# Patient Record
Sex: Male | Born: 1950 | Race: White | Hispanic: No | Marital: Married | Smoking: Former smoker
Health system: Southern US, Community
[De-identification: ages and names within clinical notes are randomized; demographics above are authoritative.]

## PROBLEM LIST (undated history)

## (undated) DIAGNOSIS — K219 Gastro-esophageal reflux disease without esophagitis: Secondary | ICD-10-CM

## (undated) DIAGNOSIS — J45909 Unspecified asthma, uncomplicated: Secondary | ICD-10-CM

## (undated) DIAGNOSIS — C801 Malignant (primary) neoplasm, unspecified: Secondary | ICD-10-CM

## (undated) HISTORY — DX: Gastro-esophageal reflux disease without esophagitis: K21.9

## (undated) HISTORY — DX: Malignant (primary) neoplasm, unspecified: C80.1

---

## 1999-12-18 ENCOUNTER — Encounter: Payer: Self-pay | Admitting: Emergency Medicine

## 1999-12-18 ENCOUNTER — Emergency Department (HOSPITAL_COMMUNITY): Admission: EM | Admit: 1999-12-18 | Discharge: 1999-12-18 | Payer: Self-pay | Admitting: Emergency Medicine

## 2002-01-30 ENCOUNTER — Emergency Department (HOSPITAL_COMMUNITY): Admission: EM | Admit: 2002-01-30 | Discharge: 2002-01-30 | Payer: Self-pay | Admitting: Emergency Medicine

## 2006-10-30 HISTORY — PX: COLONOSCOPY: SHX174

## 2006-11-23 ENCOUNTER — Ambulatory Visit: Payer: Self-pay | Admitting: Gastroenterology

## 2012-10-30 DIAGNOSIS — C801 Malignant (primary) neoplasm, unspecified: Secondary | ICD-10-CM

## 2012-10-30 HISTORY — DX: Malignant (primary) neoplasm, unspecified: C80.1

## 2012-11-12 ENCOUNTER — Ambulatory Visit: Payer: Self-pay | Admitting: Urology

## 2013-07-08 ENCOUNTER — Ambulatory Visit: Payer: Self-pay | Admitting: Urology

## 2013-07-09 ENCOUNTER — Ambulatory Visit: Payer: Self-pay | Admitting: Urology

## 2013-09-05 HISTORY — PX: PROSTATE SURGERY: SHX751

## 2015-02-09 IMAGING — CT CT ABDOMEN AND PELVIS WITHOUT AND WITH CONTRAST
2 of 4 series · 13 of 32 positions shown, 18 images · non-contrast
Comparison: none

REASON FOR EXAM: Prostate CA
COMMENTS:

[Series 2: wo 3.0 i40f 3 · axial · 0.83mm/px · z∈[-1054,-817]mm · 5 of 159 slices shown]
[im 20/159  soft-tissue]
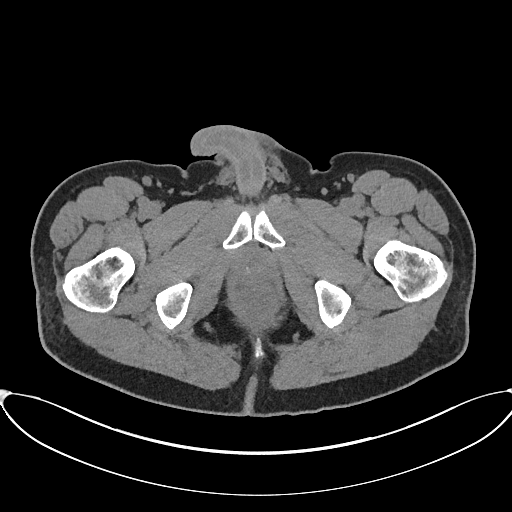
[im 40/159  soft-tissue]
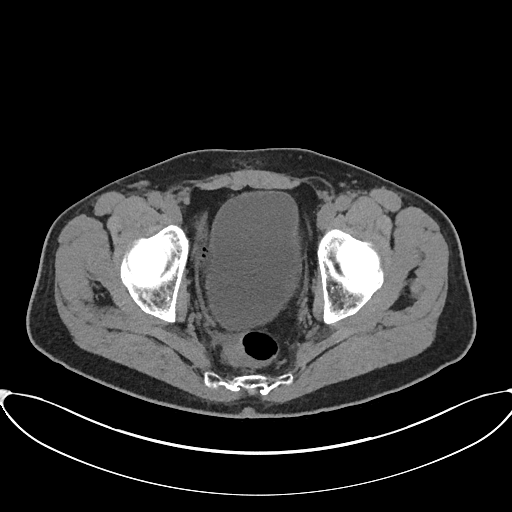
[im 60/159  soft-tissue]
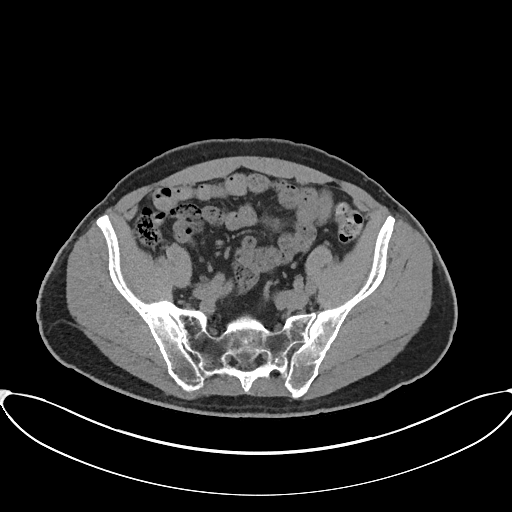
[im 80/159  soft-tissue]
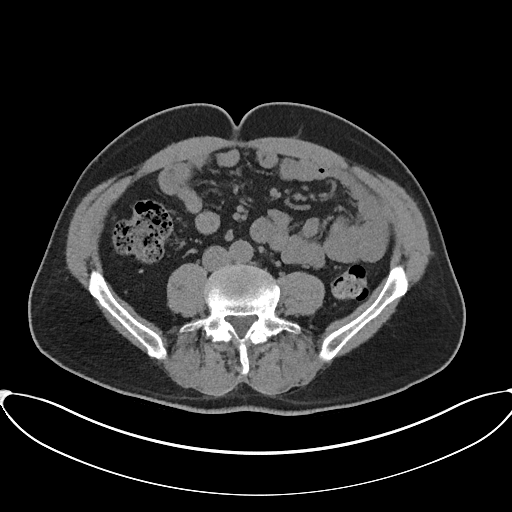
[im 99/159  soft-tissue]
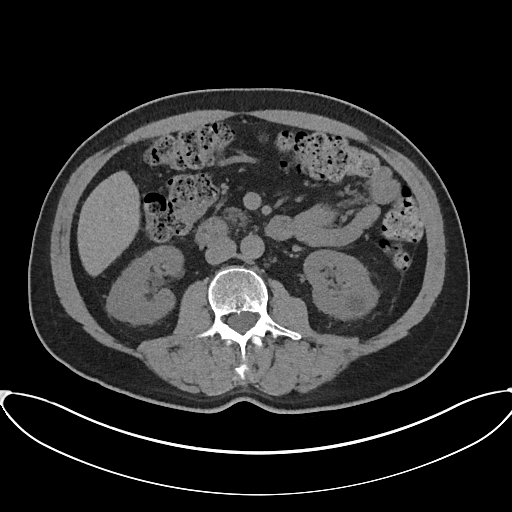

[Series 4: with 3.0 i40f 3 · axial · 0.83mm/px · z∈[-1054,-691]mm · 8 of 157 slices shown, 13 images]
[im 18/157  soft-tissue]
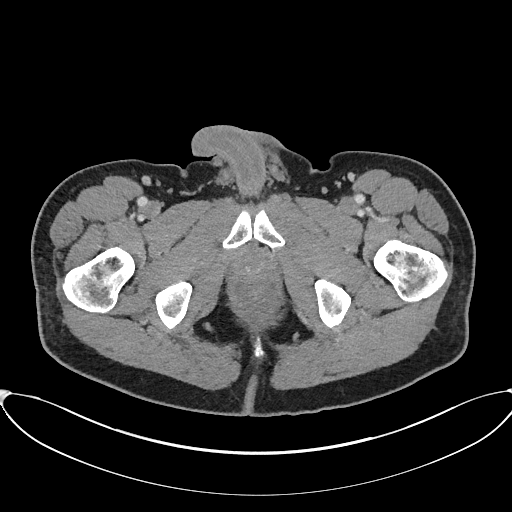
[im 18/157  bone]
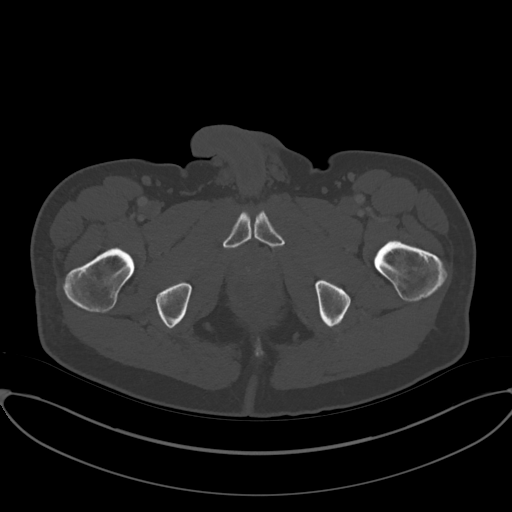
[im 35/157  soft-tissue]
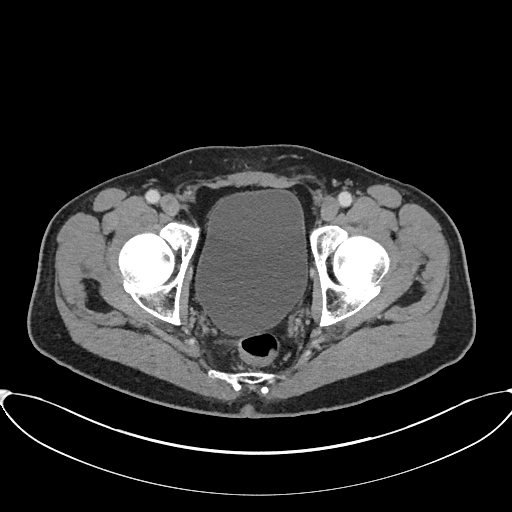
[im 53/157  soft-tissue]
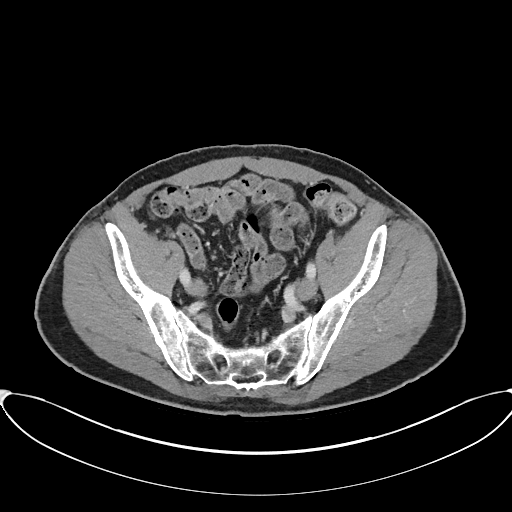
[im 70/157  soft-tissue]
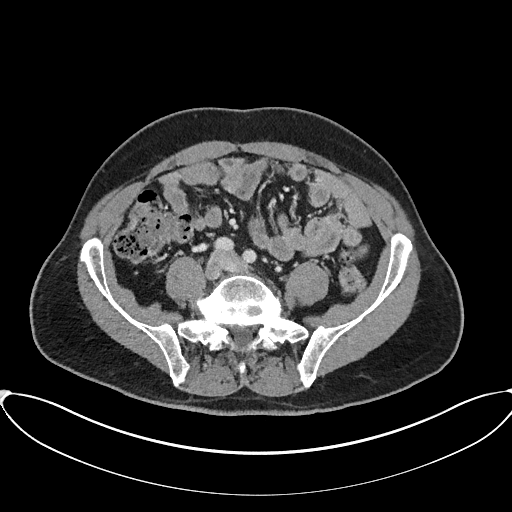
[im 87/157  soft-tissue]
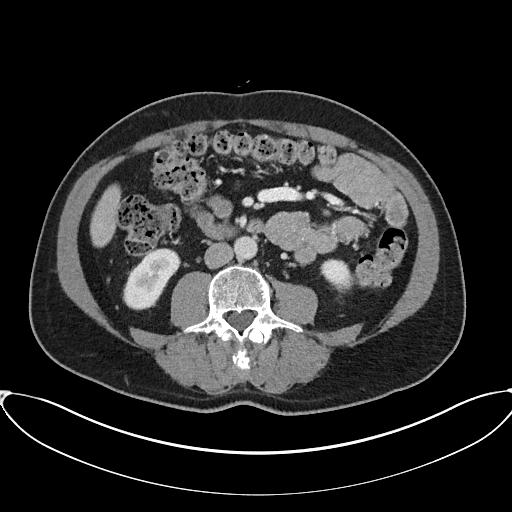
[im 87/157  lung]
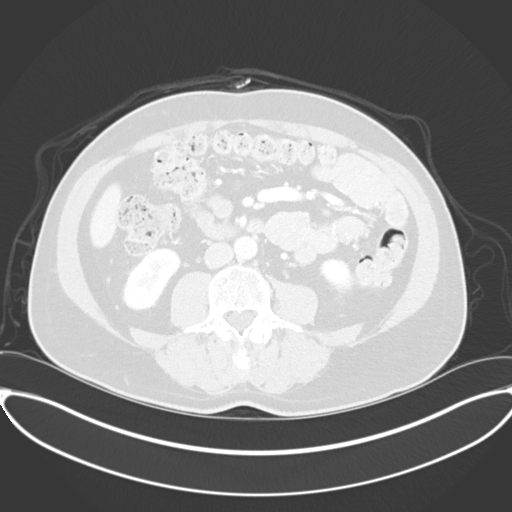
[im 105/157  soft-tissue]
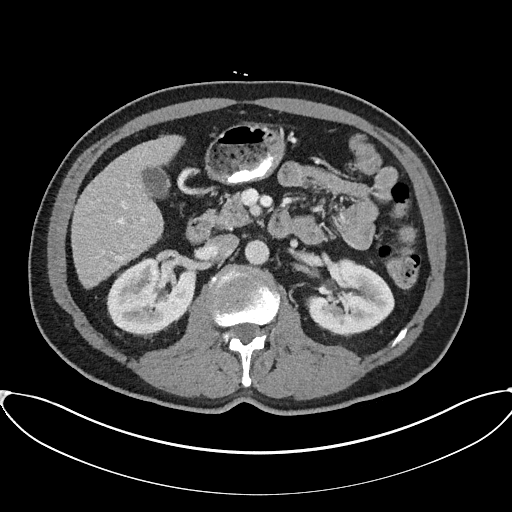
[im 105/157  lung]
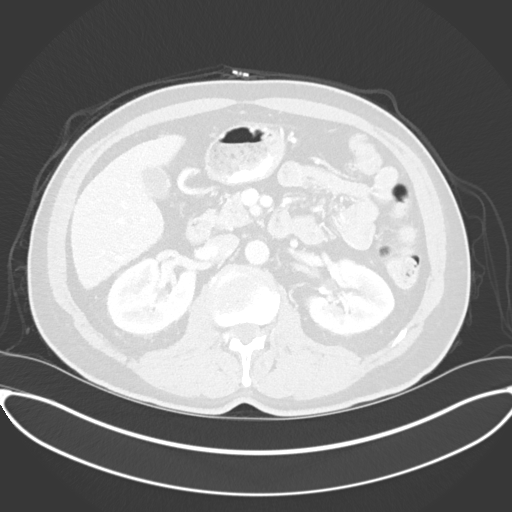
[im 122/157  soft-tissue]
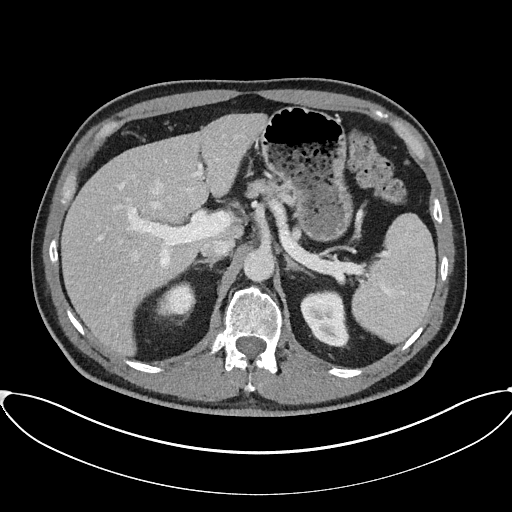
[im 122/157  lung]
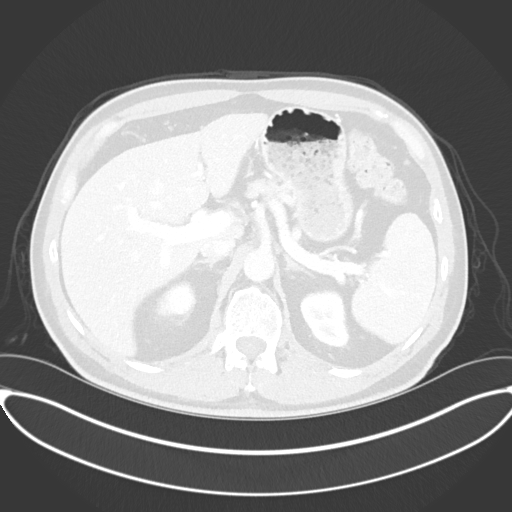
[im 139/157  soft-tissue]
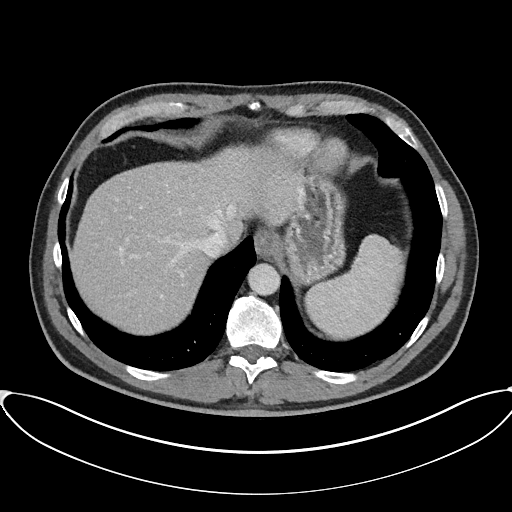
[im 139/157  lung]
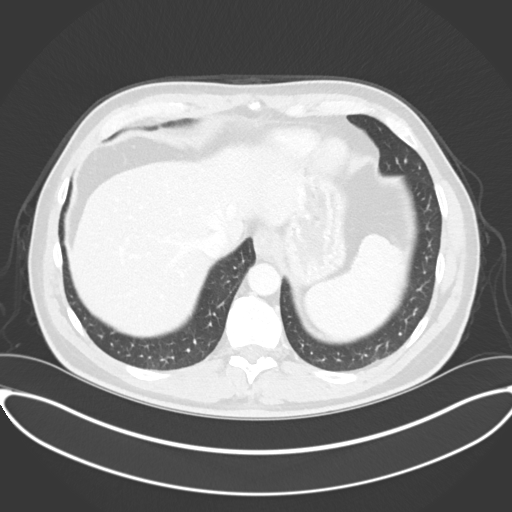

[13 of 32 positions shown; findings below may reference images not displayed]

PROCEDURE:     KCT - KCT ABDOMEN/PELVIS W/WO  - July 09, 2013  [DATE]

RESULT:     A triphasic CT scan was performed through the abdomen and pelvis
with reconstructions at 3 mm intervals and slice thicknesses. For the
postcontrast images the patient received 100 cc of Isovue 370.

On the noncontrast images there is a nonobstructing 3 mm diameter mid to
upper pole stone on the left. In the lower pole of the left kidney there is
a hypodensity measuring 1.6 cm in diameter. It has Hounsfield measurement of
+6 precontrast, +13 immediately postcontrast, and  +11 on delayed images.
The right kidney demonstrates normal enhancement with no evidence of stones
or obstruction. The moderately distended urinary bladder demonstrates normal
enhancement. A prominent impression is made upon the bladder base by the
enlarged prostate gland. No inguinal nor pelvic sidewall lymphadenopathy is
demonstrated. There is a tiny amount of free fluid in the pelvis.

The unopacified loops of small and large bowel exhibit no evidence of
obstruction. I cannot exclude an element of constipation. The liver,
gallbladder, pancreas, spleen, partially distended stomach, and adrenal
glands are normal in appearance. The caliber of the abdominal aorta is
normal. There is no periaortic nor pericaval lymphadenopathy.

The lung bases are clear. The lumbar vertebral bodies are preserved in
height. The bony pelvis appears adequately mineralized without lytic nor
blastic lesions.
IMPRESSION: 1. There is no intra-abdominal nor pelvic lymphadenopathy. There is
enlargement of the prostate gland which produces an impression upon the
urinary bladder base.
2. There is a nonobstructing mid to upper pole stone in the left kidney.
There is a hypodensity in the lower pole of the left kidney with Hounsfield
measurements most compatible with a cyst.
3. There is no acute hepatobiliary abnormality.
4. There is no acute bowel abnormality. I cannot exclude an element of
constipation.

[REDACTED]

## 2015-03-15 ENCOUNTER — Encounter: Payer: Self-pay | Admitting: *Deleted

## 2015-03-23 ENCOUNTER — Ambulatory Visit (INDEPENDENT_AMBULATORY_CARE_PROVIDER_SITE_OTHER): Payer: Managed Care, Other (non HMO) | Admitting: General Surgery

## 2015-03-23 ENCOUNTER — Encounter: Payer: Self-pay | Admitting: General Surgery

## 2015-03-23 VITALS — BP 122/72 | HR 68 | Resp 12 | Ht 72.0 in | Wt 196.0 lb

## 2015-03-23 DIAGNOSIS — K4090 Unilateral inguinal hernia, without obstruction or gangrene, not specified as recurrent: Secondary | ICD-10-CM | POA: Diagnosis not present

## 2015-03-23 MED ORDER — DEXTROSE 5 % IV SOLN: 2.0000 g | INTRAVENOUS | Status: AC

## 2015-03-23 NOTE — Progress Notes (Signed)
Patient ID: Ricky Obrien, male   DOB: [birth year], 64 y.o.   MRN: [id number]  Chief Complaint  Patient presents with  . Other    inguinal hernia    HPI Ricky Obrien is a 64 y.o. male here today for a evaluation of a right inguinal hernia . Patient states that he first noticed the hernia in 12/2014, Dr. Kary Kos prescribed Meloxicam for the discomfort, which did not help with the pain. Patient states he can reduce the hernia. No GI problems.  HPI  Past Medical History  Diagnosis Date  . Cancer 2014    prostate cancer  . Acid reflux     Past Surgical History  Procedure Laterality Date  . Prostate surgery  09/05/13  . Colonoscopy  2008    Family History  Problem Relation Age of Onset  . Stroke Mother   . Cancer Father     Social History History  Substance Use Topics  . Smoking status: Former Smoker -- 1.00 packs/day for 20 years    Types: Cigarettes  . Smokeless tobacco: Not on file  . Alcohol Use: No    No Known Allergies  Current Outpatient Prescriptions  Medication Sig Dispense Refill  . Ascorbic Acid (VITAMIN C) 100 MG tablet Take 1,000 mg by mouth daily.    Marland Kitchen aspirin 81 MG tablet Take 81 mg by mouth daily.    . Calcium Carb-Cholecalciferol (CALCIUM 500 +D PO) Take by mouth.    . Multiple Vitamins-Minerals (MULTIVITAMIN WITH MINERALS) tablet Take 1 tablet by mouth daily.    Marland Kitchen NEXIUM 40 MG capsule   1  . vitamin E 400 UNIT capsule Take 400 Units by mouth daily.     No current facility-administered medications for this visit.    Review of Systems Review of Systems  Constitutional: Negative.   Respiratory: Negative.   Cardiovascular: Negative.     Blood pressure 122/72, pulse 68, resp. rate 12, height 6' (1.829 m), weight 196 lb (88.905 kg).  Physical Exam Physical Exam  Constitutional: He is oriented to person, place, and time. He appears well-developed and well-nourished.  Eyes: Conjunctivae are normal.  Neck: Neck supple. No thyromegaly  present.  Cardiovascular: Normal rate and regular rhythm.   Pulmonary/Chest: Effort normal and breath sounds normal.  Abdominal: Soft. Bowel sounds are normal. There is tenderness in the right lower quadrant. A hernia is present. Hernia confirmed positive in the right inguinal area (medium size, reducible, tender . Also may have a small one developing on left).  Lymphadenopathy:    He has no cervical adenopathy.  Neurological: He is alert and oriented to person, place, and time.  Skin: Skin is warm and dry.    Data Reviewed Notes reviewed  Assessment    Right inguinal hernia present. This is symptomatic. Possible small Left inguinal hernia developing.    Plan    Discussed hernia repair, reasons, risks with and without surgery.     The patient will be scheduled for inguinal hernia surgery repair.  Hernia precautions and incarceration were discussed with the patient. If they develop symptoms of an incarcerated hernia, they were encouraged to seek prompt medical attention.  I have recommended repair of the hernia using mesh on an outpatient basis in the near future. The risk of infection was reviewed. The role of prosthetic mesh to minimize the risk of recurrence was reviewed.  Patient is scheduled for surgery at [facility] on 04/01/15. He will pre admit by phone. Patient is aware of date and  instructions.   PCP: Buzzy Han 03/23/2015, 12:32 PM

## 2015-03-23 NOTE — Patient Instructions (Addendum)

## 2015-03-23 NOTE — Addendum Note (Signed)
Addended by: Christene Lye on: 03/23/2015 03:55 PM   Modules accepted: Orders

## 2015-03-25 ENCOUNTER — Other Ambulatory Visit: Payer: Self-pay

## 2015-03-26 ENCOUNTER — Encounter: Payer: Self-pay | Admitting: *Deleted

## 2015-03-26 DIAGNOSIS — Z7982 Long term (current) use of aspirin: Secondary | ICD-10-CM | POA: Diagnosis not present

## 2015-03-26 DIAGNOSIS — K219 Gastro-esophageal reflux disease without esophagitis: Secondary | ICD-10-CM | POA: Diagnosis not present

## 2015-03-26 DIAGNOSIS — K4090 Unilateral inguinal hernia, without obstruction or gangrene, not specified as recurrent: Secondary | ICD-10-CM | POA: Diagnosis present

## 2015-03-26 DIAGNOSIS — Z87891 Personal history of nicotine dependence: Secondary | ICD-10-CM | POA: Diagnosis not present

## 2015-03-26 DIAGNOSIS — Z8546 Personal history of malignant neoplasm of prostate: Secondary | ICD-10-CM | POA: Diagnosis not present

## 2015-03-26 DIAGNOSIS — Z823 Family history of stroke: Secondary | ICD-10-CM | POA: Diagnosis not present

## 2015-03-26 DIAGNOSIS — Z79899 Other long term (current) drug therapy: Secondary | ICD-10-CM | POA: Diagnosis not present

## 2015-03-26 DIAGNOSIS — Z809 Family history of malignant neoplasm, unspecified: Secondary | ICD-10-CM | POA: Diagnosis not present

## 2015-03-26 NOTE — Patient Instructions (Signed)
  Your procedure is scheduled on: 04-01-15 Report to [facility] To find out your arrival time please call [phone] between 1PM - 3PM on 03-31-15  Remember: Instructions that are not followed completely may result in serious medical risk, up to and including death, or upon the discretion of your surgeon and anesthesiologist your surgery may need to be rescheduled.    _X___ 1. Do not eat food or drink liquids after midnight. No gum chewing or hard candies.     _X___ 2. No Alcohol for 24 hours before or after surgery.   ____ 3. Bring all medications with you on the day of surgery if instructed.    ____ 4. Notify your doctor if there is any change in your medical condition     (cold, fever, infections).     Do not wear jewelry, make-up, hairpins, clips or nail polish.  Do not wear lotions, powders, or perfumes. You may wear deodorant.  Do not shave 48 hours prior to surgery. Men may shave face and neck.  Do not bring valuables to the hospital.    [facility] is not responsible for any belongings or valuables.               Contacts, dentures or bridgework may not be worn into surgery.  Leave your suitcase in the car. After surgery it may be brought to your room.  For patients admitted to the hospital, discharge time is determined by your treatment team.   Patients discharged the day of surgery will not be allowed to drive home.   Please read over the following fact sheets that you were given:     __X__ Take these medicines the morning of surgery with A SIP OF WATER:    1. [place]  2. TAKE AN EXTRA DOSE OF NEXIUM  3.   4.  5.  6.  ____ Fleet Enema (as directed)   ____ Use CHG Soap as directed  ____ Use inhalers on the day of surgery  ____ Stop metformin 2 days prior to surgery    ____ Take 1/2 of usual insulin dose the night before surgery and none on the morning of surgery.   _X__ Stop Coumadin/Plavix/aspirin NOW  _X__ Stop  Anti-inflammatories-STOP ADVIL NOW-TYLENOL OK   _X_ Stop supplements until after surgery-VITAMIN C AND E NOW  ____ Bring C-Pap to the hospital.

## 2015-04-01 ENCOUNTER — Encounter
Admission: RE | Disposition: A | Discharge status: Home / Self Care | Payer: Self-pay | Source: Ambulatory Visit | Attending: General Surgery

## 2015-04-01 ENCOUNTER — Ambulatory Visit: Payer: Managed Care, Other (non HMO) | Admitting: Anesthesiology

## 2015-04-01 ENCOUNTER — Ambulatory Visit
Admission: RE | Admit: 2015-04-01 | Discharge: 2015-04-01 | Disposition: A | Discharge status: Home / Self Care | Payer: Managed Care, Other (non HMO) | Source: Ambulatory Visit | Attending: General Surgery | Admitting: General Surgery

## 2015-04-01 ENCOUNTER — Encounter: Payer: Self-pay | Admitting: *Deleted

## 2015-04-01 DIAGNOSIS — Z7982 Long term (current) use of aspirin: Secondary | ICD-10-CM | POA: Insufficient documentation

## 2015-04-01 DIAGNOSIS — Z823 Family history of stroke: Secondary | ICD-10-CM | POA: Insufficient documentation

## 2015-04-01 DIAGNOSIS — Z87891 Personal history of nicotine dependence: Secondary | ICD-10-CM | POA: Insufficient documentation

## 2015-04-01 DIAGNOSIS — Z809 Family history of malignant neoplasm, unspecified: Secondary | ICD-10-CM | POA: Insufficient documentation

## 2015-04-01 DIAGNOSIS — Z8546 Personal history of malignant neoplasm of prostate: Secondary | ICD-10-CM | POA: Insufficient documentation

## 2015-04-01 DIAGNOSIS — K4090 Unilateral inguinal hernia, without obstruction or gangrene, not specified as recurrent: Secondary | ICD-10-CM | POA: Diagnosis not present

## 2015-04-01 DIAGNOSIS — K219 Gastro-esophageal reflux disease without esophagitis: Secondary | ICD-10-CM | POA: Insufficient documentation

## 2015-04-01 DIAGNOSIS — Z79899 Other long term (current) drug therapy: Secondary | ICD-10-CM | POA: Insufficient documentation

## 2015-04-01 HISTORY — PX: INGUINAL HERNIA REPAIR: SHX194

## 2015-04-01 HISTORY — PX: HERNIA REPAIR: SHX51

## 2015-04-01 HISTORY — DX: Unspecified asthma, uncomplicated: J45.909

## 2015-04-01 SURGERY — REPAIR, HERNIA, INGUINAL, ADULT
Anesthesia: General | Laterality: Right | Wound class: Clean

## 2015-04-01 MED ORDER — LACTATED RINGERS IV SOLN
INTRAVENOUS | Status: DC | PRN
  Administered 2015-04-01 (×2): via INTRAVENOUS

## 2015-04-01 MED ORDER — CEFAZOLIN SODIUM-DEXTROSE 2-3 GM-% IV SOLR
INTRAVENOUS | Status: AC
Start: 2015-04-01 — End: 2015-04-01
  Administered 2015-04-01: 2 g via INTRAVENOUS
  Filled 2015-04-01: qty 50

## 2015-04-01 MED ORDER — CEFAZOLIN SODIUM-DEXTROSE 2-3 GM-% IV SOLR
2.0000 g | INTRAVENOUS | Status: AC
  Administered 2015-04-01: 2 g via INTRAVENOUS

## 2015-04-01 MED ORDER — ACETAMINOPHEN 10 MG/ML IV SOLN
INTRAVENOUS | Status: DC | PRN
  Administered 2015-04-01: 1000 mg via INTRAVENOUS

## 2015-04-01 MED ORDER — OXYCODONE-ACETAMINOPHEN 5-325 MG PO TABS: 1.0000 | ORAL_TABLET | ORAL | Status: DC | PRN

## 2015-04-01 MED ORDER — ONDANSETRON HCL 4 MG/2ML IJ SOLN
INTRAMUSCULAR | Status: AC
  Filled 2015-04-01: qty 2

## 2015-04-01 MED ORDER — BUPIVACAINE HCL (PF) 0.5 % IJ SOLN
INTRAMUSCULAR | Status: AC
  Filled 2015-04-01: qty 30

## 2015-04-01 MED ORDER — BUPIVACAINE HCL 0.5 % IJ SOLN
INTRAMUSCULAR | Status: DC | PRN
  Administered 2015-04-01: 30 mL via SUBCUTANEOUS

## 2015-04-01 MED ORDER — ONDANSETRON HCL 4 MG/2ML IJ SOLN
INTRAMUSCULAR | Status: DC | PRN
  Administered 2015-04-01: 4 mg via INTRAVENOUS

## 2015-04-01 MED ORDER — MIDAZOLAM HCL 2 MG/2ML IJ SOLN
INTRAMUSCULAR | Status: DC | PRN
  Administered 2015-04-01: 2 mg via INTRAVENOUS

## 2015-04-01 MED ORDER — LACTATED RINGERS IV SOLN
Freq: Once | INTRAVENOUS | Status: AC
  Administered 2015-04-01: 07:00:00 via INTRAVENOUS

## 2015-04-01 MED ORDER — FENTANYL CITRATE (PF) 100 MCG/2ML IJ SOLN
25.0000 ug | INTRAMUSCULAR | Status: DC | PRN
  Administered 2015-04-01 (×3): 25 ug via INTRAVENOUS

## 2015-04-01 MED ORDER — KETAMINE HCL 50 MG/ML IJ SOLN
INTRAMUSCULAR | Status: DC | PRN
  Administered 2015-04-01 (×12): 5 mg via INTRAMUSCULAR

## 2015-04-01 MED ORDER — PROPOFOL INFUSION 10 MG/ML OPTIME: INTRAVENOUS | Status: DC | PRN

## 2015-04-01 MED ORDER — DEXAMETHASONE SODIUM PHOSPHATE 10 MG/ML IJ SOLN
INTRAMUSCULAR | Status: DC | PRN
  Administered 2015-04-01: 8 mg via INTRAVENOUS

## 2015-04-01 MED ORDER — PROPOFOL INFUSION 10 MG/ML OPTIME
INTRAVENOUS | Status: DC | PRN
  Administered 2015-04-01: 50 ug/kg/min via INTRAVENOUS

## 2015-04-01 MED ORDER — ACETAMINOPHEN 10 MG/ML IV SOLN
INTRAVENOUS | Status: AC
  Filled 2015-04-01: qty 100

## 2015-04-01 MED ORDER — FENTANYL CITRATE (PF) 100 MCG/2ML IJ SOLN
INTRAMUSCULAR | Status: DC | PRN
  Administered 2015-04-01: 100 ug via INTRAVENOUS

## 2015-04-01 MED ORDER — LIDOCAINE HCL (CARDIAC) 20 MG/ML IV SOLN
INTRAVENOUS | Status: DC | PRN
  Administered 2015-04-01: 50 mg via INTRAVENOUS

## 2015-04-01 MED ORDER — ONDANSETRON HCL 4 MG/2ML IJ SOLN
4.0000 mg | Freq: Once | INTRAMUSCULAR | Status: AC | PRN
  Administered 2015-04-01: 4 mg via INTRAVENOUS

## 2015-04-01 MED ORDER — FENTANYL CITRATE (PF) 100 MCG/2ML IJ SOLN
INTRAMUSCULAR | Status: AC
  Administered 2015-04-01: 25 ug via INTRAVENOUS
  Filled 2015-04-01: qty 2

## 2015-04-01 MED ORDER — LIDOCAINE HCL (PF) 1 % IJ SOLN
INTRAMUSCULAR | Status: AC
  Filled 2015-04-01: qty 30

## 2015-04-01 SURGICAL SUPPLY — 30 items
BLADE CLIPPER SURG (BLADE) ×3 IMPLANT
BLADE SURG 15 STRL SS SAFETY (BLADE) ×3 IMPLANT
CANISTER SUCT 1200ML W/VALVE (MISCELLANEOUS) ×3 IMPLANT
CHLORAPREP W/TINT 26ML (MISCELLANEOUS) ×3 IMPLANT
DECANTER SPIKE VIAL GLASS SM (MISCELLANEOUS) ×3 IMPLANT
DRAIN PENROSE 1/4X12 LTX (DRAIN) ×3 IMPLANT
DRAPE LAPAROTOMY 100X77 ABD (DRAPES) ×3 IMPLANT
GLOVE BIO SURGEON STRL SZ7 (GLOVE) ×3 IMPLANT
GOWN STRL REUS W/ TWL LRG LVL3 (GOWN DISPOSABLE) ×2 IMPLANT
GOWN STRL REUS W/TWL LRG LVL3 (GOWN DISPOSABLE) ×6
KIT RM TURNOVER STRD PROC AR (KITS) ×3 IMPLANT
LABEL OR SOLS (LABEL) ×3 IMPLANT
LIQUID BAND (GAUZE/BANDAGES/DRESSINGS) ×3 IMPLANT
MESH PARIETEX PROGRIP RIGHT (Mesh General) ×3 IMPLANT
NDL HPO THNWL 1X22GA REG BVL (NEEDLE) IMPLANT
NDL SAFETY 25GX1.5 (NEEDLE) ×3 IMPLANT
NEEDLE SAFETY 22GX1 (NEEDLE)
NS IRRIG 500ML POUR BTL (IV SOLUTION) ×3 IMPLANT
PACK BASIN MINOR ARMC (MISCELLANEOUS) ×3 IMPLANT
PAD GROUND ADULT SPLIT (MISCELLANEOUS) ×3 IMPLANT
SUT PDS 2-0 27IN (SUTURE) ×6 IMPLANT
SUT VIC AB 2-0 SH 27 (SUTURE) ×6
SUT VIC AB 2-0 SH 27XBRD (SUTURE) ×2 IMPLANT
SUT VIC AB 3-0 54X BRD REEL (SUTURE) ×1 IMPLANT
SUT VIC AB 3-0 BRD 54 (SUTURE) ×3
SUT VIC AB 3-0 SH 27 (SUTURE) ×3
SUT VIC AB 3-0 SH 27X BRD (SUTURE) ×1 IMPLANT
SUT VIC AB 4-0 FS2 27 (SUTURE) ×3 IMPLANT
SYR CONTROL 10ML (SYRINGE) ×3 IMPLANT
TAPE TRANSPORE STRL 2 31045 (GAUZE/BANDAGES/DRESSINGS) ×3 IMPLANT

## 2015-04-01 NOTE — Op Note (Signed)
Preop diagnosis: Right inguinal hernia  Post op diagnosis: Same, indirect type  Operation: Repair right inguinal hernia  Anesthesia: Monitored anesthesia care. Local asthenic of 1% Xylocaine mixed with 0.5% Marcaine total of 30 mL   Surgeon: S.G.Angelina Neece  Assist:  Complications: None  EBL: Less than 20 mL  Drains: None  Description: Patient was brought to the operating room and placed supine on the operating table. With adequate sedation and monitoring the right groin was prepped and draped sterile field. Timeout was performed. Incision was planned along the medial two thirds of the inguinal canal and local asthenic was instilled to obtain an adequate block. Skin incision was made and deepened through the layers down to the external oblique and bleeding was controlled cautery. The external oblique was opened along the line of its fibers through the external ring. There was some scarring with adhesion of the cord to the external ring area which subsequently proved to be a inflamed and scarred fatty tissue which was removed at the end of the repair. The cord was then transected off the posterior wall and this fascia opened. An indirect sac was identified which was freed completely down to the internal ring area and then opened to reveal a small amount of peritoneal fluid but no other contents. High ligation was performed with 20 Vicryls and the sac was amputated. The stump was fixed to the undersurface of the internal oblique the same stitch. The posterior wall was then reinforced by placement of a Parietex Progrip mesh. Medial and was tacked to the pubic tubercle with 2 stitches of 2-0 PDS. The lateral ends were tucked and made the external oblique. The ilioinguinal nerve was identified was preserved using the procedure and at the time of the mesh placement. Wound was irrigated. External oblique was closed with number running 2-0 PDS. Subcutaneous layer closed with 30 Vicryl. Skin closed with  subcuticular 40 Vicryls reinforced with Liqui band. No immediate problems were encountered. Patient was subsequently transferred to recovery room in stable condition.

## 2015-04-01 NOTE — Anesthesia Procedure Notes (Signed)
Procedure Name: MAC Date/Time: 04/01/2015 7:38 AM Performed by: Christy Sartorius Pre-anesthesia Checklist: Patient identified, Emergency Drugs available, Suction available, Patient being monitored and Timeout performed Oxygen Delivery Method: Simple face mask Comments: #7 Nasal trumpet orally tolerated well

## 2015-04-01 NOTE — H&P (View-Only) (Signed)
Patient ID: Ricky Obrien, male   DOB: [birth year], 64 y.o.   MRN: [id number]  Chief Complaint  Patient presents with  . Other    inguinal hernia    HPI Ricky Obrien is a 64 y.o. male here today for a evaluation of a right inguinal hernia . Patient states that he first noticed the hernia in 12/2014, Dr. Kary Kos prescribed Meloxicam for the discomfort, which did not help with the pain. Patient states he can reduce the hernia. No GI problems.  HPI  Past Medical History  Diagnosis Date  . Cancer 2014    prostate cancer  . Acid reflux     Past Surgical History  Procedure Laterality Date  . Prostate surgery  09/05/13  . Colonoscopy  2008    Family History  Problem Relation Age of Onset  . Stroke Mother   . Cancer Father     Social History History  Substance Use Topics  . Smoking status: Former Smoker -- 1.00 packs/day for 20 years    Types: Cigarettes  . Smokeless tobacco: Not on file  . Alcohol Use: No    No Known Allergies  Current Outpatient Prescriptions  Medication Sig Dispense Refill  . Ascorbic Acid (VITAMIN C) 100 MG tablet Take 1,000 mg by mouth daily.    Marland Kitchen aspirin 81 MG tablet Take 81 mg by mouth daily.    . Calcium Carb-Cholecalciferol (CALCIUM 500 +D PO) Take by mouth.    . Multiple Vitamins-Minerals (MULTIVITAMIN WITH MINERALS) tablet Take 1 tablet by mouth daily.    Marland Kitchen NEXIUM 40 MG capsule   1  . vitamin E 400 UNIT capsule Take 400 Units by mouth daily.     No current facility-administered medications for this visit.    Review of Systems Review of Systems  Constitutional: Negative.   Respiratory: Negative.   Cardiovascular: Negative.     Blood pressure 122/72, pulse 68, resp. rate 12, height 6' (1.829 m), weight 196 lb (88.905 kg).  Physical Exam Physical Exam  Constitutional: He is oriented to person, place, and time. He appears well-developed and well-nourished.  Eyes: Conjunctivae are normal.  Neck: Neck supple. No thyromegaly  present.  Cardiovascular: Normal rate and regular rhythm.   Pulmonary/Chest: Effort normal and breath sounds normal.  Abdominal: Soft. Bowel sounds are normal. There is tenderness in the right lower quadrant. A hernia is present. Hernia confirmed positive in the right inguinal area (medium size, reducible, tender . Also may have a small one developing on left).  Lymphadenopathy:    He has no cervical adenopathy.  Neurological: He is alert and oriented to person, place, and time.  Skin: Skin is warm and dry.    Data Reviewed Notes reviewed  Assessment    Right inguinal hernia present. This is symptomatic. Possible small Left inguinal hernia developing.    Plan    Discussed hernia repair, reasons, risks with and without surgery.     The patient will be scheduled for inguinal hernia surgery repair.  Hernia precautions and incarceration were discussed with the patient. If they develop symptoms of an incarcerated hernia, they were encouraged to seek prompt medical attention.  I have recommended repair of the hernia using mesh on an outpatient basis in the near future. The risk of infection was reviewed. The role of prosthetic mesh to minimize the risk of recurrence was reviewed.  Patient is scheduled for surgery at [facility] on 04/01/15. He will pre admit by phone. Patient is aware of date and  instructions.   PCP: Buzzy Han 03/23/2015, 12:32 PM

## 2015-04-01 NOTE — Anesthesia Postprocedure Evaluation (Signed)
  Anesthesia Post-op Note  Patient: Ricky Obrien  Procedure(s) Performed: Procedure(s): HERNIA REPAIR INGUINAL ADULT (Right)  Anesthesia type:General  Patient location: PACU  Post pain: Pain level controlled  Post assessment: Post-op Vital signs reviewed, Patient's Cardiovascular Status Stable, Respiratory Function Stable, Patent Airway and No signs of Nausea or vomiting  Post vital signs: Reviewed and stable  Last Vitals:  Filed Vitals:   04/01/15 0920  BP:   Pulse: 56  Temp:   Resp: 14    Level of consciousness: awake, alert  and patient cooperative  Complications: No apparent anesthesia complications

## 2015-04-01 NOTE — Discharge Instructions (Signed)
AMBULATORY SURGERY  DISCHARGE INSTRUCTIONS   1) The drugs that you were given will stay in your system until tomorrow so for the next 24 hours you should not:  A) Drive an automobile B) Make any legal decisions C) Drink any alcoholic beverage   2) You may resume regular meals tomorrow.  Today it is better to start with liquids and gradually work up to solid foods.  You may eat anything you prefer, but it is better to start with liquids, then soup and crackers, and gradually work up to solid foods.   3) Please notify your doctor immediately if you have any unusual bleeding, trouble breathing, redness and pain at the surgery site, drainage, fever, or pain not relieved by medication.     4) Additional Instructions:Splint abdomen with a small towel or pillow any time you cough, sneeze or move.

## 2015-04-01 NOTE — Anesthesia Preprocedure Evaluation (Addendum)
Anesthesia Evaluation  Patient identified by MRN, date of birth, ID band  Reviewed: Allergy & Precautions, NPO status , Patient's Chart, lab work & pertinent test results  Airway Mallampati: I  TM Distance: >3 FB Neck ROM: Full    Dental  (+) Teeth Intact   Pulmonary asthma (remote hx) , former smoker (quit x 15 yrs),          Cardiovascular     Neuro/Psych    GI/Hepatic GERD-  Medicated and Controlled,  Endo/Other    Renal/GU      Musculoskeletal   Abdominal   Peds  Hematology   Anesthesia Other Findings   Reproductive/Obstetrics                          Anesthesia Physical Anesthesia Plan  ASA: II  Anesthesia Plan: General   Post-op Pain Management:    Induction:   Airway Management Planned: Simple Face Mask  Additional Equipment:   Intra-op Plan:   Post-operative Plan:   Informed Consent: I have reviewed the patients History and Physical, chart, labs and discussed the procedure including the risks, benefits and alternatives for the proposed anesthesia with the patient or authorized representative who has indicated his/her understanding and acceptance.     Plan Discussed with:   Anesthesia Plan Comments:       Anesthesia Quick Evaluation

## 2015-04-01 NOTE — Interval H&P Note (Signed)
History and Physical Interval Note:  04/01/2015 7:36 AM  Ricky Obrien  has presented today for surgery, with the diagnosis of INGUINAL HERNIA  The various methods of treatment have been discussed with the patient and family. After consideration of risks, benefits and other options for treatment, the patient has consented to  Procedure(s): HERNIA REPAIR INGUINAL ADULT (Right) as a surgical intervention .  The patient's history has been reviewed, patient examined, no change in status, stable for surgery.  I have reviewed the patient's chart and labs.  Questions were answered to the patient's satisfaction.     [name]

## 2015-04-01 NOTE — Transfer of Care (Signed)
Immediate Anesthesia Transfer of Care Note  Patient: Ricky Obrien  Procedure(s) Performed: Procedure(s): HERNIA REPAIR INGUINAL ADULT (Right)  Patient Location: PACU  Anesthesia Type:General  Level of Consciousness: awake and oriented  Airway & Oxygen Therapy: Patient Spontanous Breathing and Patient connected to face mask oxygen  Post-op Assessment: Report given to RN  Post vital signs: Reviewed and stable  Last Vitals:  Filed Vitals:   04/01/15 0903  BP: 138/91  Pulse: 54  Temp: 36.3 C  Resp: 14    Complications: No apparent anesthesia complications

## 2015-04-08 ENCOUNTER — Ambulatory Visit (INDEPENDENT_AMBULATORY_CARE_PROVIDER_SITE_OTHER): Payer: Managed Care, Other (non HMO) | Admitting: General Surgery

## 2015-04-08 ENCOUNTER — Encounter: Payer: Self-pay | Admitting: General Surgery

## 2015-04-08 VITALS — BP 134/76 | HR 62 | Resp 12 | Ht 72.0 in | Wt 197.0 lb

## 2015-04-08 DIAGNOSIS — K4090 Unilateral inguinal hernia, without obstruction or gangrene, not specified as recurrent: Secondary | ICD-10-CM

## 2015-04-08 NOTE — Patient Instructions (Addendum)
The patient is aware to call back for any questions or concerns. Gradually increase activity. No heavy lifting for 2 weeks.

## 2015-04-08 NOTE — Progress Notes (Signed)
Here today for postoperative visit, right inguinal hernia repair 04-01-15. States he is doing well.  Incision healing well.  Gradually increase activity. No heavy lifting for 2 weeks. Follow up in 5 weeks. Return to work 03-26-15. PCP:  [place] Pink

## 2015-05-13 ENCOUNTER — Encounter: Payer: Self-pay | Admitting: General Surgery

## 2015-05-13 ENCOUNTER — Ambulatory Visit (INDEPENDENT_AMBULATORY_CARE_PROVIDER_SITE_OTHER): Payer: Managed Care, Other (non HMO) | Admitting: General Surgery

## 2015-05-13 VITALS — BP 105/60 | HR 72 | Resp 13 | Ht 72.0 in | Wt 197.0 lb

## 2015-05-13 DIAGNOSIS — K4090 Unilateral inguinal hernia, without obstruction or gangrene, not specified as recurrent: Secondary | ICD-10-CM

## 2015-05-13 NOTE — Progress Notes (Signed)
Patient ID: Ricky Obrien, male   DOB: [birth year], 64 y.o.   MRN: [id number] Here for post op follow up for right inguinal hernia repair done on 04/01/15. He reports that he is doing well with no complaints. He has been working with no problems. No problems with using the bathroom  Hernia repair is intact on right side. Incision well healed.  Return as needed.   PCP: [place] Pink

## 2015-05-17 ENCOUNTER — Encounter: Payer: Self-pay | Admitting: General Surgery

## 2020-02-26 ENCOUNTER — Ambulatory Visit: Payer: Self-pay | Attending: Internal Medicine

## 2020-02-26 DIAGNOSIS — Z23 Encounter for immunization: Secondary | ICD-10-CM

## 2020-02-26 NOTE — Progress Notes (Signed)
   Covid-19 Vaccination Clinic  Name:  Ricky Obrien    MRN: [id number] DOB: July 09, 1951  02/26/2020  [name] was observed post Covid-19 immunization for 15 minutes without incident. He was provided with Vaccine Information Sheet and instruction to access the V-Safe system.   [name] was instructed to call 911 with any severe reactions post vaccine: Marland Kitchen Difficulty breathing  . Swelling of face and throat  . A fast heartbeat  . A bad rash all over body  . Dizziness and weakness   Immunizations Administered    Name Date Dose VIS Date Route   Pfizer COVID-19 Vaccine 02/26/2020  8:16 AM 0.3 mL 12/24/2018 Intramuscular   Manufacturer: [employer], [employer]   Lot: [id number]   [place]: [id number]

## 2020-03-23 ENCOUNTER — Ambulatory Visit: Payer: Self-pay | Attending: Internal Medicine

## 2020-03-23 DIAGNOSIS — Z23 Encounter for immunization: Secondary | ICD-10-CM

## 2020-03-23 NOTE — Progress Notes (Signed)
° °  Covid-19 Vaccination Clinic  Name:  Ricky Obrien    MRN: [id number] DOB: 05/30/1951  03/23/2020  Mr. Spanbauer was observed post Covid-19 immunization for 15 minutes without incident. He was provided with Vaccine Information Sheet and instruction to access the V-Safe system.   Mr. Holsopple was instructed to call 911 with any severe reactions post vaccine:  Difficulty breathing   Swelling of face and throat   A fast heartbeat   A bad rash all over body   Dizziness and weakness   Immunizations Administered    Name Date Dose VIS Date Route   Pfizer COVID-19 Vaccine 03/23/2020  8:05 AM 0.3 mL 12/24/2018 Intramuscular   Manufacturer: [place]   Lot: [id number]   [place]: [id number]

## 2020-06-08 DIAGNOSIS — Z20822 Contact with and (suspected) exposure to covid-19: Secondary | ICD-10-CM | POA: Diagnosis not present

## 2020-06-23 DIAGNOSIS — K4090 Unilateral inguinal hernia, without obstruction or gangrene, not specified as recurrent: Secondary | ICD-10-CM | POA: Diagnosis not present

## 2020-06-23 DIAGNOSIS — R5383 Other fatigue: Secondary | ICD-10-CM | POA: Diagnosis not present

## 2020-06-23 DIAGNOSIS — R5381 Other malaise: Secondary | ICD-10-CM | POA: Diagnosis not present

## 2020-07-06 ENCOUNTER — Ambulatory Visit: Payer: Self-pay | Admitting: Surgery

## 2020-07-06 ENCOUNTER — Other Ambulatory Visit: Admission: RE | Admit: 2020-07-06 | Payer: PPO | Source: Ambulatory Visit

## 2020-07-06 DIAGNOSIS — Z01818 Encounter for other preprocedural examination: Secondary | ICD-10-CM | POA: Diagnosis not present

## 2020-07-06 DIAGNOSIS — K4090 Unilateral inguinal hernia, without obstruction or gangrene, not specified as recurrent: Secondary | ICD-10-CM | POA: Diagnosis not present

## 2020-07-06 DIAGNOSIS — Z20822 Contact with and (suspected) exposure to covid-19: Secondary | ICD-10-CM | POA: Diagnosis not present

## 2020-07-07 ENCOUNTER — Other Ambulatory Visit: Admission: RE | Admit: 2020-07-07 | Payer: PPO | Source: Ambulatory Visit

## 2020-07-07 ENCOUNTER — Encounter
Admission: RE | Admit: 2020-07-07 | Discharge: 2020-07-07 | Disposition: A | Discharge status: Home / Self Care | Payer: PPO | Source: Ambulatory Visit | Attending: Surgery | Admitting: Surgery

## 2020-07-07 ENCOUNTER — Encounter: Payer: Self-pay | Admitting: Surgery

## 2020-07-07 DIAGNOSIS — Z20822 Contact with and (suspected) exposure to covid-19: Secondary | ICD-10-CM | POA: Insufficient documentation

## 2020-07-07 DIAGNOSIS — Z01818 Encounter for other preprocedural examination: Secondary | ICD-10-CM | POA: Insufficient documentation

## 2020-07-07 LAB — SARS CORONAVIRUS 2 (TAT 6-24 HRS): SARS Coronavirus 2: NEGATIVE

## 2020-07-07 MED ORDER — CEFAZOLIN SODIUM-DEXTROSE 2-4 GM/100ML-% IV SOLN
2.0000 g | INTRAVENOUS | Status: AC
  Administered 2020-07-08: 2 g via INTRAVENOUS

## 2020-07-07 NOTE — Patient Instructions (Signed)
Your procedure is scheduled on: 07-08-20 THURSDAY Report to Same Day Surgery 2nd floor medical mall [facility] Entrance-take elevator on left to 2nd floor.  Check in with surgery information desk.) @ 0630  Remember: Instructions that are not followed completely may result in serious medical risk, up to and including death, or upon the discretion of your surgeon and anesthesiologist your surgery may need to be rescheduled.    _x___ 1. Do not eat food after midnight the night before your procedure. NO GUM OR CANDY AFTER MIDNIGHT. You may drink clear liquids up to 2 hours before you are scheduled to arrive at the hospital for your procedure.  Do not drink clear liquids within 2 hours of your scheduled arrival to the hospital.  Clear liquids include  --Water or Apple juice without pulp  -- Gatorade  --Black Coffee or Clear Tea (No milk, no creamers, do not add anything to the coffee or Tea-OK TO ADD SUGAR)     __x__ 2. No Alcohol for 24 hours before or after surgery.   __x__3. No Smoking or e-cigarettes for 24 prior to surgery.  Do not use any chewable tobacco products for at least 6 hour prior to surgery   ____  4. Bring all medications with you on the day of surgery if instructed.    __x__ 5. Notify your doctor if there is any change in your medical condition     (cold, fever, infections).    x___6. On the morning of surgery brush your teeth with toothpaste and water.  You may rinse your mouth with mouth wash if you wish.  Do not swallow any toothpaste or mouthwash.   Do not wear jewelry, make-up, hairpins, clips or nail polish.  Do not wear lotions, powders, or perfumes. You may wear deodorant.  Do not shave 48 hours prior to surgery. Men may shave face and neck.  Do not bring valuables to the hospital.    [facility] is not responsible for any belongings or valuables.               Contacts, dentures or bridgework may not be worn into surgery.  Leave your suitcase in the car. After  surgery it may be brought to your room.  For patients admitted to the hospital, discharge time is determined by your  treatment team.  _  Patients discharged the day of surgery will not be allowed to drive home.  You will need someone to drive you home and stay with you the night of your procedure.    Please read over the following fact sheets that you were given:   [facility] Preparing for Surgery   _x___ TAKE THE FOLLOWING MEDICATION THE MORNING OF SURGERY WITH A SMALL SIP OF WATER. These include:  1. [place]   2. TAKE AN EXTRA NEXIUM TONIGHT BEFORE BED  3.  4.  5.  6.  ____Fleets enema or Magnesium Citrate as directed.   ____ Use CHG Soap or sage wipes as directed on instruction sheet   ____ Use inhalers on the day of surgery and bring to hospital day of surgery  ____ Stop Metformin and Janumet 2 days prior to surgery.    ____ Take 1/2 of usual insulin dose the night before surgery and none on the morning surgery.   _x___ Follow recommendations from Cardiologist, Pulmonologist or PCP regarding  stopping Aspirin, Coumadin, Plavix ,Eliquis, Effient, or Pradaxa, and Pletal-LAST DOSE OF ASPIRIN WAS 07-06-20  X____Stop Anti-inflammatories such as Advil, Aleve,  Ibuprofen, Motrin, Naproxen, Naprosyn, Goodies powders or aspirin products NOW-OK to take Tylenol    ____ Stop supplements until after surgery.   ____ Bring C-Pap to the hospital.

## 2020-07-08 ENCOUNTER — Other Ambulatory Visit: Payer: Self-pay

## 2020-07-08 ENCOUNTER — Ambulatory Visit: Payer: PPO | Admitting: Certified Registered"

## 2020-07-08 ENCOUNTER — Encounter
Admission: RE | Disposition: A | Discharge status: Home / Self Care | Payer: Self-pay | Source: Home / Self Care | Attending: Surgery

## 2020-07-08 ENCOUNTER — Ambulatory Visit
Admission: RE | Admit: 2020-07-08 | Discharge: 2020-07-08 | Disposition: A | Discharge status: Home / Self Care | Payer: PPO | Attending: Surgery | Admitting: Surgery

## 2020-07-08 ENCOUNTER — Encounter: Payer: Self-pay | Admitting: Surgery

## 2020-07-08 DIAGNOSIS — Z8546 Personal history of malignant neoplasm of prostate: Secondary | ICD-10-CM | POA: Diagnosis not present

## 2020-07-08 DIAGNOSIS — Z01810 Encounter for preprocedural cardiovascular examination: Secondary | ICD-10-CM | POA: Diagnosis not present

## 2020-07-08 DIAGNOSIS — K4090 Unilateral inguinal hernia, without obstruction or gangrene, not specified as recurrent: Secondary | ICD-10-CM | POA: Diagnosis not present

## 2020-07-08 DIAGNOSIS — K219 Gastro-esophageal reflux disease without esophagitis: Secondary | ICD-10-CM | POA: Insufficient documentation

## 2020-07-08 DIAGNOSIS — Z87891 Personal history of nicotine dependence: Secondary | ICD-10-CM | POA: Diagnosis not present

## 2020-07-08 DIAGNOSIS — Z7982 Long term (current) use of aspirin: Secondary | ICD-10-CM | POA: Insufficient documentation

## 2020-07-08 DIAGNOSIS — D1739 Benign lipomatous neoplasm of skin and subcutaneous tissue of other sites: Secondary | ICD-10-CM | POA: Diagnosis not present

## 2020-07-08 DIAGNOSIS — J449 Chronic obstructive pulmonary disease, unspecified: Secondary | ICD-10-CM | POA: Diagnosis not present

## 2020-07-08 DIAGNOSIS — Z79899 Other long term (current) drug therapy: Secondary | ICD-10-CM | POA: Insufficient documentation

## 2020-07-08 HISTORY — PX: INGUINAL HERNIA REPAIR: SHX194

## 2020-07-08 SURGERY — REPAIR, HERNIA, INGUINAL, ADULT
Anesthesia: General | Site: Abdomen | Laterality: Left

## 2020-07-08 MED ORDER — EPHEDRINE SULFATE 50 MG/ML IJ SOLN
INTRAMUSCULAR | Status: DC | PRN
  Administered 2020-07-08 (×3): 5 mg via INTRAVENOUS

## 2020-07-08 MED ORDER — CHLORHEXIDINE GLUCONATE 0.12 % MT SOLN: 15.0000 mL | Freq: Once | OROMUCOSAL | Status: AC

## 2020-07-08 MED ORDER — ACETAMINOPHEN 500 MG PO TABS: 1000.0000 mg | ORAL_TABLET | ORAL | Status: AC

## 2020-07-08 MED ORDER — BUPIVACAINE-EPINEPHRINE (PF) 0.5% -1:200000 IJ SOLN
INTRAMUSCULAR | Status: AC
  Filled 2020-07-08: qty 30

## 2020-07-08 MED ORDER — CELECOXIB 200 MG PO CAPS
ORAL_CAPSULE | ORAL | Status: AC
  Administered 2020-07-08: 200 mg via ORAL
  Filled 2020-07-08: qty 1

## 2020-07-08 MED ORDER — SEVOFLURANE IN SOLN
RESPIRATORY_TRACT | Status: AC
  Filled 2020-07-08: qty 250

## 2020-07-08 MED ORDER — LIDOCAINE HCL (PF) 2 % IJ SOLN
INTRAMUSCULAR | Status: AC
  Filled 2020-07-08: qty 10

## 2020-07-08 MED ORDER — PHENYLEPHRINE HCL (PRESSORS) 10 MG/ML IV SOLN
INTRAVENOUS | Status: AC
  Filled 2020-07-08: qty 1

## 2020-07-08 MED ORDER — GABAPENTIN 300 MG PO CAPS
ORAL_CAPSULE | ORAL | Status: AC
  Administered 2020-07-08: 300 mg via ORAL
  Filled 2020-07-08: qty 1

## 2020-07-08 MED ORDER — LIDOCAINE HCL (PF) 2 % IJ SOLN
INTRAMUSCULAR | Status: AC
  Filled 2020-07-08: qty 5

## 2020-07-08 MED ORDER — ONDANSETRON HCL 4 MG/2ML IJ SOLN: 4.0000 mg | Freq: Once | INTRAMUSCULAR | Status: DC | PRN

## 2020-07-08 MED ORDER — CELECOXIB 200 MG PO CAPS: 200.0000 mg | ORAL_CAPSULE | ORAL | Status: AC

## 2020-07-08 MED ORDER — DEXAMETHASONE SODIUM PHOSPHATE 10 MG/ML IJ SOLN
INTRAMUSCULAR | Status: AC
  Filled 2020-07-08: qty 1

## 2020-07-08 MED ORDER — ACETAMINOPHEN 325 MG PO TABS: 650.0000 mg | ORAL_TABLET | Freq: Three times a day (TID) | ORAL | 0 refills | Status: AC | PRN

## 2020-07-08 MED ORDER — CEFAZOLIN SODIUM-DEXTROSE 2-4 GM/100ML-% IV SOLN
INTRAVENOUS | Status: AC
  Filled 2020-07-08: qty 100

## 2020-07-08 MED ORDER — ONDANSETRON HCL 4 MG/2ML IJ SOLN
INTRAMUSCULAR | Status: DC | PRN
  Administered 2020-07-08: 4 mg via INTRAVENOUS

## 2020-07-08 MED ORDER — FENTANYL CITRATE (PF) 100 MCG/2ML IJ SOLN
INTRAMUSCULAR | Status: DC | PRN
Start: 2020-07-08 — End: 2020-07-08
  Administered 2020-07-08: 50 ug via INTRAVENOUS

## 2020-07-08 MED ORDER — HYDROCODONE-ACETAMINOPHEN 5-325 MG PO TABS
1.0000 | ORAL_TABLET | Freq: Four times a day (QID) | ORAL | 0 refills | Status: AC | PRN
Start: 2020-07-08 — End: ?

## 2020-07-08 MED ORDER — BUPIVACAINE-EPINEPHRINE (PF) 0.5% -1:200000 IJ SOLN
INTRAMUSCULAR | Status: DC | PRN
  Administered 2020-07-08: 30 mL

## 2020-07-08 MED ORDER — BUPIVACAINE LIPOSOME 1.3 % IJ SUSP
INTRAMUSCULAR | Status: DC | PRN
  Administered 2020-07-08: 20 mL

## 2020-07-08 MED ORDER — PHENYLEPHRINE HCL (PRESSORS) 10 MG/ML IV SOLN
INTRAVENOUS | Status: DC | PRN
  Administered 2020-07-08: 100 ug via INTRAVENOUS

## 2020-07-08 MED ORDER — MIDAZOLAM HCL 2 MG/2ML IJ SOLN
INTRAMUSCULAR | Status: DC | PRN
  Administered 2020-07-08: 2 mg via INTRAVENOUS

## 2020-07-08 MED ORDER — SCOPOLAMINE 1 MG/3DAYS TD PT72
MEDICATED_PATCH | TRANSDERMAL | Status: AC
  Filled 2020-07-08: qty 1

## 2020-07-08 MED ORDER — SUGAMMADEX SODIUM 200 MG/2ML IV SOLN
INTRAVENOUS | Status: DC | PRN
  Administered 2020-07-08: 200 mg via INTRAVENOUS

## 2020-07-08 MED ORDER — DOCUSATE SODIUM 100 MG PO CAPS: 100.0000 mg | ORAL_CAPSULE | Freq: Two times a day (BID) | ORAL | 0 refills | Status: AC | PRN

## 2020-07-08 MED ORDER — FENTANYL CITRATE (PF) 100 MCG/2ML IJ SOLN: 25.0000 ug | INTRAMUSCULAR | Status: DC | PRN

## 2020-07-08 MED ORDER — PROPOFOL 10 MG/ML IV BOLUS
INTRAVENOUS | Status: DC | PRN
  Administered 2020-07-08: 200 mg via INTRAVENOUS

## 2020-07-08 MED ORDER — ACETAMINOPHEN 500 MG PO TABS
ORAL_TABLET | ORAL | Status: AC
  Administered 2020-07-08: 1000 mg via ORAL
  Filled 2020-07-08: qty 2

## 2020-07-08 MED ORDER — FENTANYL CITRATE (PF) 100 MCG/2ML IJ SOLN
INTRAMUSCULAR | Status: AC
  Filled 2020-07-08: qty 2

## 2020-07-08 MED ORDER — MIDAZOLAM HCL 2 MG/2ML IJ SOLN
INTRAMUSCULAR | Status: AC
  Filled 2020-07-08: qty 2

## 2020-07-08 MED ORDER — LIDOCAINE HCL (CARDIAC) PF 100 MG/5ML IV SOSY
PREFILLED_SYRINGE | INTRAVENOUS | Status: DC | PRN
  Administered 2020-07-08: 100 mg via INTRAVENOUS

## 2020-07-08 MED ORDER — GABAPENTIN 300 MG PO CAPS: 300.0000 mg | ORAL_CAPSULE | ORAL | Status: AC

## 2020-07-08 MED ORDER — BUPIVACAINE LIPOSOME 1.3 % IJ SUSP
INTRAMUSCULAR | Status: AC
  Filled 2020-07-08: qty 20

## 2020-07-08 MED ORDER — CHLORHEXIDINE GLUCONATE 0.12 % MT SOLN
OROMUCOSAL | Status: AC
  Administered 2020-07-08: 15 mL via OROMUCOSAL
  Filled 2020-07-08: qty 15

## 2020-07-08 MED ORDER — ORAL CARE MOUTH RINSE: 15.0000 mL | Freq: Once | OROMUCOSAL | Status: AC

## 2020-07-08 MED ORDER — PROPOFOL 10 MG/ML IV BOLUS
INTRAVENOUS | Status: AC
  Filled 2020-07-08: qty 20

## 2020-07-08 MED ORDER — SCOPOLAMINE 1 MG/3DAYS TD PT72
1.0000 | MEDICATED_PATCH | TRANSDERMAL | Status: DC
  Administered 2020-07-08: 1.5 mg via TRANSDERMAL

## 2020-07-08 MED ORDER — IBUPROFEN 800 MG PO TABS: 800.0000 mg | ORAL_TABLET | Freq: Three times a day (TID) | ORAL | 0 refills | Status: AC | PRN

## 2020-07-08 MED ORDER — LACTATED RINGERS IV SOLN: INTRAVENOUS | Status: DC

## 2020-07-08 MED ORDER — ONDANSETRON HCL 4 MG/2ML IJ SOLN
INTRAMUSCULAR | Status: AC
  Filled 2020-07-08: qty 2

## 2020-07-08 MED ORDER — PROPOFOL 10 MG/ML IV BOLUS
INTRAVENOUS | Status: AC
  Filled 2020-07-08: qty 40

## 2020-07-08 MED ORDER — ROCURONIUM BROMIDE 100 MG/10ML IV SOLN
INTRAVENOUS | Status: DC | PRN
  Administered 2020-07-08: 50 mg via INTRAVENOUS

## 2020-07-08 MED ORDER — ROCURONIUM BROMIDE 10 MG/ML (PF) SYRINGE
PREFILLED_SYRINGE | INTRAVENOUS | Status: AC
  Filled 2020-07-08: qty 10

## 2020-07-08 MED ORDER — DEXAMETHASONE SODIUM PHOSPHATE 10 MG/ML IJ SOLN
INTRAMUSCULAR | Status: DC | PRN
  Administered 2020-07-08: 10 mg via INTRAVENOUS

## 2020-07-08 MED ORDER — EPHEDRINE 5 MG/ML INJ
INTRAVENOUS | Status: AC
  Filled 2020-07-08: qty 10

## 2020-07-08 MED ORDER — CHLORHEXIDINE GLUCONATE CLOTH 2 % EX PADS
6.0000 | MEDICATED_PAD | Freq: Once | CUTANEOUS | Status: AC
  Administered 2020-07-08: 6 via TOPICAL

## 2020-07-08 SURGICAL SUPPLY — 38 items
ADH SKN CLS APL DERMABOND .7 (GAUZE/BANDAGES/DRESSINGS) ×1
APL PRP STRL LF DISP 70% ISPRP (MISCELLANEOUS) ×1
BLADE CLIPPER SURG (BLADE) ×3 IMPLANT
BLADE SURG 15 STRL LF DISP TIS (BLADE) ×1 IMPLANT
BLADE SURG 15 STRL SS (BLADE) ×3
CANISTER SUCT 1200ML W/VALVE (MISCELLANEOUS) ×3 IMPLANT
CHLORAPREP W/TINT 26 (MISCELLANEOUS) ×3 IMPLANT
COVER WAND RF STERILE (DRAPES) ×3 IMPLANT
DERMABOND ADVANCED (GAUZE/BANDAGES/DRESSINGS) ×2
DERMABOND ADVANCED .7 DNX12 (GAUZE/BANDAGES/DRESSINGS) ×1 IMPLANT
DRAIN PENROSE 1/4X12 LTX STRL (WOUND CARE) ×3 IMPLANT
DRAPE LAPAROTOMY 100X77 ABD (DRAPES) ×3 IMPLANT
ELECT REM PT RETURN 9FT ADLT (ELECTROSURGICAL) ×3
ELECTRODE REM PT RTRN 9FT ADLT (ELECTROSURGICAL) ×1 IMPLANT
GLOVE BIOGEL PI IND STRL 7.0 (GLOVE) ×1 IMPLANT
GLOVE BIOGEL PI INDICATOR 7.0 (GLOVE) ×2
GLOVE SURG SYN 6.5 ES PF (GLOVE) ×6 IMPLANT
GOWN STRL REUS W/ TWL LRG LVL3 (GOWN DISPOSABLE) ×2 IMPLANT
GOWN STRL REUS W/TWL LRG LVL3 (GOWN DISPOSABLE) ×6
LABEL OR SOLS (LABEL) ×3 IMPLANT
MESH PARIETEX PROGRIP LEFT (Mesh General) ×3 IMPLANT
NEEDLE HYPO 22GX1.5 SAFETY (NEEDLE) ×6 IMPLANT
NS IRRIG 500ML POUR BTL (IV SOLUTION) ×3 IMPLANT
PACK BASIN MINOR (MISCELLANEOUS) ×3 IMPLANT
SUT ETHIBOND NAB MO 7 #0 18IN (SUTURE) ×3 IMPLANT
SUT MNCRL 4-0 (SUTURE) ×3
SUT MNCRL 4-0 27XMFL (SUTURE) ×1
SUT SILK 3 0 12 30 (SUTURE) IMPLANT
SUT SILK 3 0 SH 30 (SUTURE) IMPLANT
SUT VIC AB 2-0 CT2 27 (SUTURE) ×3 IMPLANT
SUT VIC AB 3-0 SH 27 (SUTURE) ×3
SUT VIC AB 3-0 SH 27X BRD (SUTURE) ×1 IMPLANT
SUTURE MNCRL 4-0 27XMF (SUTURE) ×1 IMPLANT
SYR 10ML LL (SYRINGE) ×3 IMPLANT
SYR 20ML LL LF (SYRINGE) ×3 IMPLANT
SYR BULB IRRIG 60ML STRL (SYRINGE) ×3 IMPLANT
TOWEL OR 17X26 4PK STRL BLUE (TOWEL DISPOSABLE) ×3 IMPLANT
WATER STERILE IRR 1000ML POUR (IV SOLUTION) ×3 IMPLANT

## 2020-07-08 NOTE — Discharge Instructions (Signed)
Hernia repair, Care After This sheet gives you information about how to care for yourself after your procedure. Your health care provider may also give you more specific instructions. If you have problems or questions, contact your health care provider. What can I expect after the procedure? After your procedure, it is common to have the following:  Pain in your abdomen, especially in the incision areas. You will be given medicine to control the pain.  Tiredness. This is a normal part of the recovery process. Your energy level will return to normal over the next several weeks.  Changes in your bowel movements, such as constipation or needing to go more often. Talk with your health care provider about how to manage this. Follow these instructions at home: Medicines   tylenol and advil as needed for discomfort.  Please alternate between the two every four hours as needed for pain.     Use narcotics, if prescribed, only when tylenol and motrin is not enough to control pain.   325-650mg  every 8hrs to max of 3000mg /24hrs (including the 325mg  in every norco dose) for the tylenol.     Advil up to 800mg  per dose every 8hrs as needed for pain.    RESUME ASPIRIN 48HRS AFTER SURGERY  PLEASE RECORD NUMBER OF PILLS TAKEN UNTIL NEXT FOLLOW UP APPT.  THIS WILL HELP DETERMINE HOW READY YOU ARE TO BE RELEASED FROM ANY ACTIVITY RESTRICTIONS  Do not drive or use heavy machinery while taking prescription pain medicine.  Do not drink alcohol while taking prescription pain medicine.  Incision care     Follow instructions from your health care provider about how to take care of your incision areas. Make sure you: ? Keep your incisions clean and dry. ? Wash your hands with soap and water before and after applying medicine to the areas, and before and after changing your bandage (dressing). If soap and water are not available, use hand sanitizer. ? Change your dressing as told by your health care  provider. ? Leave stitches (sutures), skin glue, or adhesive strips in place. These skin closures may need to stay in place for 2 weeks or longer. If adhesive strip edges start to loosen and curl up, you may trim the loose edges. Do not remove adhesive strips completely unless your health care provider tells you to do that.  Do not wear tight clothing over the incisions. Tight clothing may rub and irritate the incision areas, which may cause the incisions to open.  Do not take baths, swim, or use a hot tub until your health care provider approves. OK TO SHOWER IN 24HRS.    Check your incision area every day for signs of infection. Check for: ? More redness, swelling, or pain. ? More fluid or blood. ? Warmth. ? Pus or a bad smell. Activity  Avoid lifting anything that is heavier than 10 lb (4.5 kg) for 2 weeks or until your health care provider says it is okay.  No pushing/pulling greater than 30lbs  You may resume normal activities as told by your health care provider. Ask your health care provider what activities are safe for you.  Take rest breaks during the day as needed. Eating and drinking  Follow instructions from your health care provider about what you can eat after surgery.  To prevent or treat constipation while you are taking prescription pain medicine, your health care provider may recommend that you: ? Drink enough fluid to keep your urine clear or pale yellow. ?  Take over-the-counter or prescription medicines. ? Eat foods that are high in fiber, such as fresh fruits and vegetables, whole grains, and beans. ? Limit foods that are high in fat and processed sugars, such as fried and sweet foods. General instructions  Ask your health care provider when you will need an appointment to get your sutures or staples removed.  Keep all follow-up visits as told by your health care provider. This is important. Contact a health care provider if:  You have more redness, swelling,  or pain around your incisions.                           AMBULATORY SURGERY  DISCHARGE INSTRUCTIONS   1) The drugs that you were given will stay in your system until tomorrow so for the next 24 hours you should not:  A) Drive an automobile B) Make any legal decisions C) Drink any alcoholic beverage   2) You may resume regular meals tomorrow.  Today it is better to start with liquids and gradually work up to solid foods.  You may eat anything you prefer, but it is better to start with liquids, then soup and crackers, and gradually work up to solid foods.   3) Please notify your doctor immediately if you have any unusual bleeding, trouble breathing, redness and pain at the surgery site, drainage, fever, or pain not relieved by medication.    4) Additional Instructions:    Please contact your physician with any problems or Same Day Surgery at [phone], Monday through Friday 6 am to 4 pm, or [place] at [facility] number at [phone].                                 You have more fluid or blood coming from the incisions.  Your incisions feel warm to the touch.  You have pus or a bad smell coming from your incisions or your dressing.  You have a fever.  You have an incision that breaks open (edges not staying together) after sutures or staples have been removed. Get help right away if:  You develop a rash.  You have chest pain or difficulty breathing.  You have pain or swelling in your legs.  You feel light-headed or you faint.  Your abdomen swells (becomes distended).  You have nausea or vomiting.  You have blood in your stool (feces). This information is not intended to replace advice given to you by your health care provider. Make sure you discuss any questions you have with your health care provider. Document Released: 05/05/2005 Document Revised: 07/05/2018 Document Reviewed: 07/17/2016 Elsevier Interactive Patient  Education  2019 [employer].

## 2020-07-08 NOTE — Transfer of Care (Signed)
Immediate Anesthesia Transfer of Care Note  Patient: Pellegrino H Cannell  Procedure(s) Performed: HERNIA REPAIR INGUINAL ADULT (Left Abdomen)  Patient Location: PACU  Anesthesia Type:General  Level of Consciousness: drowsy  Airway & Oxygen Therapy: Patient Spontanous Breathing and Patient connected to face mask oxygen  Post-op Assessment: Report given to RN and Post -op Vital signs reviewed and stable  Post vital signs: Reviewed and stable  Last Vitals:  Vitals Value Taken Time  BP 120/64 07/08/20 0945  Temp    Pulse 75 07/08/20 0946  Resp 12 07/08/20 0946  SpO2 98 % 07/08/20 0946  Vitals shown include unvalidated device data.  Last Pain:  Vitals:   07/08/20 0644  TempSrc: Tympanic         Complications: No complications documented.

## 2020-07-08 NOTE — H&P (Signed)
Subjective:   CC: Non-recurrent unilateral inguinal hernia without obstruction or gangrene [K40.90]  HPI: Ricky Obrien is a 69 y.o. male who was referred by Lovie Macadamia, MD for evaluation of above. Symptoms were first noted several years ago. Pain is dull, intermittent and discomfort, confined to the left groin, without radiation. Associated with nothing specific, exacerbated by lifting. Recently more symptomatic. Lump is reducible.   Past Medical History: has a past medical history of Acid reflux, Allergic state, Asthma without status asthmaticus, unspecified, Back pain, COPD (chronic obstructive pulmonary disease) (CMS-HCC), Depression, and Prostate CA (CMS-HCC) (02/17/2013).  Past Surgical History:  Past Surgical History:  Procedure Laterality Date  . COLONOSCOPY 11/23/2006  Dr. Ivor Messier @ [place]  . COLONOSCOPY 12/06/2018  Normal colon/Repeat 84yrs/TKT  . PROSTATE SURGERY 08/2013  Robotic  . Wrist surgery   Family History: family history includes Cancer in his father; Kidney disease in an other family member; Lupus in an other family member; Stroke in his mother.  Social History: reports that he quit smoking about 19 years ago. He smoked 1.00 pack per day. He has never used smokeless tobacco. He reports that he does not drink alcohol. No history on file for drug use.  Current Medications: has a current medication list which includes the following prescription(s): ascorbic acid (vitamin c), aspirin, calcium carbonate-vitamin d3, multivitamin, nexium, and vitamin e.  Allergies:  Allergies as of 07/06/2020  . (No Known Allergies)   ROS:  A 15 point review of systems was performed and pertinent positives and negatives noted in HPI  Objective:    BP 105/70  Pulse 71  Ht 185.4 cm (6\' 1" )  Wt 90.3 kg (199 lb)  BMI 26.25 kg/m   Constitutional : alert, appears stated age, cooperative and no distress  Lymphatics/Throat: no asymmetry, masses, or scars  Respiratory:  clear to auscultation bilaterally  Cardiovascular: regular rate and rhythm  Gastrointestinal: soft, non-tender; bowel sounds normal; no masses, no organomegaly. inguinal hernia noted. small, reducible, no overlying skin changes and LEFT  Musculoskeletal: Steady gait and movement  Skin: Cool and moist, visible surgical scars periumbilical, and right groin  Psychiatric: Normal affect, non-agitated, not confused    LABS:  n/a   RADS: n/a Assessment:    Non-recurrent unilateral inguinal hernia without obstruction or gangrene [K40.90] LEFT  Plan:    1. Non-recurrent unilateral inguinal hernia without obstruction or gangrene [K40.90]  Discussed the risk of surgery including recurrence, which can be up to 50% in the case of incisional or complex hernias, possible use of prosthetic materials (mesh) and the increased risk of mesh infxn if used, bleeding, chronic pain, post-op infxn, post-op SBO or ileus, and possible re-operation to address said risks. The risks of general anesthetic, if used, includes MI, CVA, sudden death or even reaction to anesthetic medications also discussed. Alternatives include continued observation. Benefits include possible symptom relief, prevention of incarceration, strangulation, enlargement in size over time, and the risk of emergency surgery in the face of strangulation.   Typical post-op recovery time of 3-5 days with 2 weeks of activity restrictions were also discussed.  ED return precautions given for sudden increase in pain, size of hernia with accompanying fever, nausea, and/or vomiting.  The patient verbalized understanding and all questions were answered to the patient's satisfaction.  2. Patient has elected to proceed with surgical treatment. Procedure will be scheduled. Written consent was obtained.. LEFT, open, due to hx of lap prostatectomy.

## 2020-07-08 NOTE — Anesthesia Postprocedure Evaluation (Signed)
Anesthesia Post Note  Patient: Ricky Obrien  Procedure(s) Performed: HERNIA REPAIR INGUINAL ADULT WITH MESH (Left Abdomen)  Patient location during evaluation: PACU Anesthesia Type: General Level of consciousness: awake and alert and oriented Pain management: pain level controlled Vital Signs Assessment: post-procedure vital signs reviewed and stable Respiratory status: spontaneous breathing Cardiovascular status: blood pressure returned to baseline Anesthetic complications: no   No complications documented.   Last Vitals:  Vitals:   07/08/20 1041 07/08/20 1138  BP: 116/63 117/69  Pulse: 76 78  Resp: 16 16  Temp: (!) 36.1 C 36.6 C  SpO2: 96% 99%    Last Pain:  Vitals:   07/08/20 1138  TempSrc: Temporal  PainSc: 0-No pain                 Derrico Zhong

## 2020-07-08 NOTE — Op Note (Signed)
Preoperative diagnosis: LEFT Inguinal Hernia, initial reducible  Postoperative diagnosis: left indirect  Inguinal Hernia  Procedure:  Open left Inguinal hernia repair with mesh  Anesthesia: General, LMA  Surgeon: Dr. Lysle Pearl  Wound Classification: Clean  Specimen: none  Complications: None  Estimated Blood Loss: 87mL   Indications:  Patient is a 69 y.o. male developed a symptomatic left inguinal hernia. Repair was indicated to avoid complications of incarceration, obstruction and pain, and a prosthetic mesh repair was elected.  Findings: 1. Vas Deferens and cord structures identified and preserved 2. Progrip mesh used for repair 3. Adequate hemostasis achieved  Description of procedure: The patient was taken to the operating room. A time-out was completed verifying correct patient, procedure, site, positioning, and implant(s) and/or special equipment prior to beginning this procedure. The left groin was prepped and draped in the usual sterile fashion. An incision was marked in a natural skin crease and planned to end near the pubic tubercle.  The skin crease incision was made with a knife and deepened through Scarpa's and Camper's fascia with electrocautery until the aponeurosis of the external oblique was encountered. This was cleaned and the external ring was exposed. Hemostasis was achieved in the wound. An incision was made in the midportion of the external oblique aponeurosis in the direction of its fibers. The ilioinguinal nerve was identified and protected throughout the dissection. Flaps of the external oblique were developed cephalad and inferiorly.  The cord was identified. It was gently dissected free at the pubic tubercle and encircled with a Penrose drain. Attention was directed to the anteromedial aspect of the cord, where an indirect hernia sac was identified. The sac was carefully dissected free of the cord down to the level of the internal ring, excess suture ligated with  3-0 vicryl, and reduced back into abdominal cavity Adjacent cord lipoma suture ligated with 3-0 vicryl and passed off operative field with sac. The vas and testicular vessels were identified and protected from harm.   Attention then turned to the floor of the canal, which was intact. The Progrip mesh was inserted and secured to the pubic tubercle using interrupted 0 ethibond sutures. Care was taken to assure that the mesh was placed flat against the floor and wrapped loosely around the cord structures.  Hemostasis was again checked. The Penrose drain was removed. Exparel infused as an ilioinguinal block.  The external oblique aponeurosis was closed with a running suture of 2-0 Vicryl, taking care not to catch the ilioinguinal nerve in the suture line. Scarpa's fascia was closed with interrupted 3-0 Vicryl.  The deep dermal layer closed with interrupted 3-0 Vicryl.  The skin was closed with a subcuticular stitch of Monocryl 4-0. Dermabond was applied.  The testis was gently pulled down into its anatomic position in the scrotum.  The patient tolerated the procedure well and was taken to the postanesthesia care unit in stable condition. Sponge and instrument count correct at end of procedure.

## 2020-07-08 NOTE — Interval H&P Note (Signed)
History and Physical Interval Note:  07/08/2020 7:39 AM  Ricky Obrien  has presented today for surgery, with the diagnosis of K40.90 Non recurrent unilateral inguinal hernia without obstruction or gangrene.  The various methods of treatment have been discussed with the patient and family. After consideration of risks, benefits and other options for treatment, the patient has consented to  Procedure(s): HERNIA REPAIR INGUINAL ADULT (Left) as a surgical intervention.  The patient's history has been reviewed, patient examined, no change in status, stable for surgery.  I have reviewed the patient's chart and labs.  Questions were answered to the patient's satisfaction.     Zebediah Beezley Lysle Pearl

## 2020-07-08 NOTE — Anesthesia Preprocedure Evaluation (Addendum)
Anesthesia Evaluation  Patient identified by MRN, date of birth, ID band  Reviewed: Allergy & Precautions, NPO status , Patient's Chart, lab work & pertinent test results  Airway Mallampati: II  TM Distance: >3 FB Neck ROM: Full    Dental  (+) Teeth Intact   Pulmonary asthma (remote hx) , former smoker,           Cardiovascular negative cardio ROS       Neuro/Psych negative neurological ROS  negative psych ROS   GI/Hepatic GERD  Medicated and Controlled,  Endo/Other    Renal/GU   negative genitourinary   Musculoskeletal   Abdominal   Peds negative pediatric ROS (+)  Hematology   Anesthesia Other Findings Past Medical History: No date: Acid reflux No date: Asthma     Comment:  h/o no inhalers 2014: Cancer ([place])     Comment:  prostate cancer  Reproductive/Obstetrics                             Anesthesia Physical  Anesthesia Plan  ASA: II  Anesthesia Plan: General   Post-op Pain Management:    Induction:   PONV Risk Score and Plan:   Airway Management Planned: Oral ETT and LMA  Additional Equipment:   Intra-op Plan:   Post-operative Plan: Extubation in OR  Informed Consent: I have reviewed the patients History and Physical, chart, labs and discussed the procedure including the risks, benefits and alternatives for the proposed anesthesia with the patient or authorized representative who has indicated his/her understanding and acceptance.       Plan Discussed with: CRNA  Anesthesia Plan Comments:        Anesthesia Quick Evaluation

## 2020-07-08 NOTE — Anesthesia Procedure Notes (Signed)
Procedure Name: Intubation Date/Time: 07/08/2020 8:02 AM Performed by: Levert Feinstein, RN Pre-anesthesia Checklist: Patient identified, Patient being monitored, Timeout performed, Emergency Drugs available and Suction available Patient Re-evaluated:Patient Re-evaluated prior to induction Oxygen Delivery Method: Circle system utilized Preoxygenation: Pre-oxygenation with 100% oxygen Induction Type: IV induction Ventilation: Mask ventilation without difficulty Laryngoscope Size: Mac and 4 Grade View: Grade I Tube type: Oral Tube size: 7.5 mm Number of attempts: 1 Airway Equipment and Method: Stylet Placement Confirmation: ETT inserted through vocal cords under direct vision,  positive ETCO2 and breath sounds checked- equal and bilateral Secured at: 22 cm Tube secured with: Tape Dental Injury: Teeth and Oropharynx as per pre-operative assessment

## 2020-07-09 LAB — SURGICAL PATHOLOGY

## 2020-09-27 DIAGNOSIS — E785 Hyperlipidemia, unspecified: Secondary | ICD-10-CM | POA: Diagnosis not present

## 2020-09-27 DIAGNOSIS — Z0000 Encounter for general adult medical examination without abnormal findings: Secondary | ICD-10-CM | POA: Diagnosis not present

## 2020-09-27 DIAGNOSIS — Z125 Encounter for screening for malignant neoplasm of prostate: Secondary | ICD-10-CM | POA: Diagnosis not present

## 2020-10-04 DIAGNOSIS — R319 Hematuria, unspecified: Secondary | ICD-10-CM | POA: Diagnosis not present

## 2020-10-04 DIAGNOSIS — K219 Gastro-esophageal reflux disease without esophagitis: Secondary | ICD-10-CM | POA: Diagnosis not present

## 2020-10-04 DIAGNOSIS — Z0000 Encounter for general adult medical examination without abnormal findings: Secondary | ICD-10-CM | POA: Diagnosis not present

## 2020-11-04 DIAGNOSIS — R319 Hematuria, unspecified: Secondary | ICD-10-CM | POA: Diagnosis not present

## 2021-06-09 DIAGNOSIS — D2262 Melanocytic nevi of left upper limb, including shoulder: Secondary | ICD-10-CM | POA: Diagnosis not present

## 2021-06-09 DIAGNOSIS — X32XXXA Exposure to sunlight, initial encounter: Secondary | ICD-10-CM | POA: Diagnosis not present

## 2021-06-09 DIAGNOSIS — Z85828 Personal history of other malignant neoplasm of skin: Secondary | ICD-10-CM | POA: Diagnosis not present

## 2021-06-09 DIAGNOSIS — D2272 Melanocytic nevi of left lower limb, including hip: Secondary | ICD-10-CM | POA: Diagnosis not present

## 2021-06-09 DIAGNOSIS — D2261 Melanocytic nevi of right upper limb, including shoulder: Secondary | ICD-10-CM | POA: Diagnosis not present

## 2021-06-09 DIAGNOSIS — Z09 Encounter for follow-up examination after completed treatment for conditions other than malignant neoplasm: Secondary | ICD-10-CM | POA: Diagnosis not present

## 2021-06-09 DIAGNOSIS — L570 Actinic keratosis: Secondary | ICD-10-CM | POA: Diagnosis not present

## 2021-10-05 DIAGNOSIS — Z125 Encounter for screening for malignant neoplasm of prostate: Secondary | ICD-10-CM | POA: Diagnosis not present

## 2021-10-05 DIAGNOSIS — Z0000 Encounter for general adult medical examination without abnormal findings: Secondary | ICD-10-CM | POA: Diagnosis not present

## 2021-10-12 DIAGNOSIS — K219 Gastro-esophageal reflux disease without esophagitis: Secondary | ICD-10-CM | POA: Diagnosis not present

## 2021-10-12 DIAGNOSIS — C61 Malignant neoplasm of prostate: Secondary | ICD-10-CM | POA: Diagnosis not present

## 2021-10-12 DIAGNOSIS — R319 Hematuria, unspecified: Secondary | ICD-10-CM | POA: Diagnosis not present

## 2021-10-12 DIAGNOSIS — J449 Chronic obstructive pulmonary disease, unspecified: Secondary | ICD-10-CM | POA: Diagnosis not present

## 2021-10-12 DIAGNOSIS — Z0000 Encounter for general adult medical examination without abnormal findings: Secondary | ICD-10-CM | POA: Diagnosis not present

## 2022-01-05 DIAGNOSIS — D0439 Carcinoma in situ of skin of other parts of face: Secondary | ICD-10-CM | POA: Diagnosis not present

## 2022-01-05 DIAGNOSIS — D485 Neoplasm of uncertain behavior of skin: Secondary | ICD-10-CM | POA: Diagnosis not present

## 2022-01-05 DIAGNOSIS — L570 Actinic keratosis: Secondary | ICD-10-CM | POA: Diagnosis not present

## 2022-01-05 DIAGNOSIS — X32XXXA Exposure to sunlight, initial encounter: Secondary | ICD-10-CM | POA: Diagnosis not present

## 2022-01-11 DIAGNOSIS — C61 Malignant neoplasm of prostate: Secondary | ICD-10-CM | POA: Diagnosis not present

## 2022-01-11 DIAGNOSIS — R319 Hematuria, unspecified: Secondary | ICD-10-CM | POA: Diagnosis not present

## 2022-01-23 DIAGNOSIS — D0439 Carcinoma in situ of skin of other parts of face: Secondary | ICD-10-CM | POA: Diagnosis not present

## 2022-04-26 DIAGNOSIS — C61 Malignant neoplasm of prostate: Secondary | ICD-10-CM | POA: Diagnosis not present

## 2022-05-09 DIAGNOSIS — R5383 Other fatigue: Secondary | ICD-10-CM | POA: Diagnosis not present

## 2022-05-09 DIAGNOSIS — R5381 Other malaise: Secondary | ICD-10-CM | POA: Diagnosis not present

## 2022-06-05 DIAGNOSIS — D2261 Melanocytic nevi of right upper limb, including shoulder: Secondary | ICD-10-CM | POA: Diagnosis not present

## 2022-06-05 DIAGNOSIS — Z872 Personal history of diseases of the skin and subcutaneous tissue: Secondary | ICD-10-CM | POA: Diagnosis not present

## 2022-06-05 DIAGNOSIS — L570 Actinic keratosis: Secondary | ICD-10-CM | POA: Diagnosis not present

## 2022-06-05 DIAGNOSIS — Z09 Encounter for follow-up examination after completed treatment for conditions other than malignant neoplasm: Secondary | ICD-10-CM | POA: Diagnosis not present

## 2022-06-05 DIAGNOSIS — Z85828 Personal history of other malignant neoplasm of skin: Secondary | ICD-10-CM | POA: Diagnosis not present

## 2022-06-05 DIAGNOSIS — D225 Melanocytic nevi of trunk: Secondary | ICD-10-CM | POA: Diagnosis not present

## 2022-06-05 DIAGNOSIS — D2262 Melanocytic nevi of left upper limb, including shoulder: Secondary | ICD-10-CM | POA: Diagnosis not present

## 2022-08-23 DIAGNOSIS — L570 Actinic keratosis: Secondary | ICD-10-CM | POA: Diagnosis not present

## 2022-08-23 DIAGNOSIS — D485 Neoplasm of uncertain behavior of skin: Secondary | ICD-10-CM | POA: Diagnosis not present

## 2022-10-11 DIAGNOSIS — Z125 Encounter for screening for malignant neoplasm of prostate: Secondary | ICD-10-CM | POA: Diagnosis not present

## 2022-10-11 DIAGNOSIS — J449 Chronic obstructive pulmonary disease, unspecified: Secondary | ICD-10-CM | POA: Diagnosis not present

## 2022-10-11 DIAGNOSIS — Z0000 Encounter for general adult medical examination without abnormal findings: Secondary | ICD-10-CM | POA: Diagnosis not present

## 2022-10-18 DIAGNOSIS — J449 Chronic obstructive pulmonary disease, unspecified: Secondary | ICD-10-CM | POA: Diagnosis not present

## 2022-10-18 DIAGNOSIS — Z0000 Encounter for general adult medical examination without abnormal findings: Secondary | ICD-10-CM | POA: Diagnosis not present

## 2022-10-18 DIAGNOSIS — K219 Gastro-esophageal reflux disease without esophagitis: Secondary | ICD-10-CM | POA: Diagnosis not present

## 2022-10-18 DIAGNOSIS — C61 Malignant neoplasm of prostate: Secondary | ICD-10-CM | POA: Diagnosis not present

## 2023-04-04 DIAGNOSIS — L570 Actinic keratosis: Secondary | ICD-10-CM | POA: Diagnosis not present

## 2023-04-04 DIAGNOSIS — D2272 Melanocytic nevi of left lower limb, including hip: Secondary | ICD-10-CM | POA: Diagnosis not present

## 2023-04-04 DIAGNOSIS — D2261 Melanocytic nevi of right upper limb, including shoulder: Secondary | ICD-10-CM | POA: Diagnosis not present

## 2023-04-04 DIAGNOSIS — X32XXXA Exposure to sunlight, initial encounter: Secondary | ICD-10-CM | POA: Diagnosis not present

## 2023-04-04 DIAGNOSIS — Z85828 Personal history of other malignant neoplasm of skin: Secondary | ICD-10-CM | POA: Diagnosis not present

## 2023-04-04 DIAGNOSIS — D225 Melanocytic nevi of trunk: Secondary | ICD-10-CM | POA: Diagnosis not present

## 2023-04-04 DIAGNOSIS — D2262 Melanocytic nevi of left upper limb, including shoulder: Secondary | ICD-10-CM | POA: Diagnosis not present

## 2023-04-04 DIAGNOSIS — Z872 Personal history of diseases of the skin and subcutaneous tissue: Secondary | ICD-10-CM | POA: Diagnosis not present

## 2023-04-04 DIAGNOSIS — Z09 Encounter for follow-up examination after completed treatment for conditions other than malignant neoplasm: Secondary | ICD-10-CM | POA: Diagnosis not present

## 2023-10-15 DIAGNOSIS — J449 Chronic obstructive pulmonary disease, unspecified: Secondary | ICD-10-CM | POA: Diagnosis not present

## 2023-10-15 DIAGNOSIS — Z125 Encounter for screening for malignant neoplasm of prostate: Secondary | ICD-10-CM | POA: Diagnosis not present

## 2023-10-15 DIAGNOSIS — Z0000 Encounter for general adult medical examination without abnormal findings: Secondary | ICD-10-CM | POA: Diagnosis not present

## 2023-10-22 DIAGNOSIS — C61 Malignant neoplasm of prostate: Secondary | ICD-10-CM | POA: Diagnosis not present

## 2023-10-22 DIAGNOSIS — Z0000 Encounter for general adult medical examination without abnormal findings: Secondary | ICD-10-CM | POA: Diagnosis not present

## 2023-10-22 DIAGNOSIS — J449 Chronic obstructive pulmonary disease, unspecified: Secondary | ICD-10-CM | POA: Diagnosis not present

## 2023-10-22 DIAGNOSIS — Z1331 Encounter for screening for depression: Secondary | ICD-10-CM | POA: Diagnosis not present

## 2023-10-22 DIAGNOSIS — K219 Gastro-esophageal reflux disease without esophagitis: Secondary | ICD-10-CM | POA: Diagnosis not present

## 2023-10-22 DIAGNOSIS — R319 Hematuria, unspecified: Secondary | ICD-10-CM | POA: Diagnosis not present

## 2023-10-22 DIAGNOSIS — R5383 Other fatigue: Secondary | ICD-10-CM | POA: Diagnosis not present

## 2023-10-22 DIAGNOSIS — R5381 Other malaise: Secondary | ICD-10-CM | POA: Diagnosis not present

## 2024-01-21 DIAGNOSIS — C61 Malignant neoplasm of prostate: Secondary | ICD-10-CM | POA: Diagnosis not present

## 2024-01-21 DIAGNOSIS — Z125 Encounter for screening for malignant neoplasm of prostate: Secondary | ICD-10-CM | POA: Diagnosis not present

## 2024-01-21 DIAGNOSIS — R319 Hematuria, unspecified: Secondary | ICD-10-CM | POA: Diagnosis not present

## 2024-02-06 DIAGNOSIS — Z85828 Personal history of other malignant neoplasm of skin: Secondary | ICD-10-CM | POA: Diagnosis not present

## 2024-02-06 DIAGNOSIS — D2261 Melanocytic nevi of right upper limb, including shoulder: Secondary | ICD-10-CM | POA: Diagnosis not present

## 2024-02-06 DIAGNOSIS — D225 Melanocytic nevi of trunk: Secondary | ICD-10-CM | POA: Diagnosis not present

## 2024-02-06 DIAGNOSIS — D2272 Melanocytic nevi of left lower limb, including hip: Secondary | ICD-10-CM | POA: Diagnosis not present

## 2024-02-06 DIAGNOSIS — L570 Actinic keratosis: Secondary | ICD-10-CM | POA: Diagnosis not present

## 2024-02-06 DIAGNOSIS — D2262 Melanocytic nevi of left upper limb, including shoulder: Secondary | ICD-10-CM | POA: Diagnosis not present

## 2024-04-14 DIAGNOSIS — R5381 Other malaise: Secondary | ICD-10-CM | POA: Diagnosis not present

## 2024-04-14 DIAGNOSIS — Z125 Encounter for screening for malignant neoplasm of prostate: Secondary | ICD-10-CM | POA: Diagnosis not present

## 2024-04-14 DIAGNOSIS — R5383 Other fatigue: Secondary | ICD-10-CM | POA: Diagnosis not present

## 2024-04-14 DIAGNOSIS — C61 Malignant neoplasm of prostate: Secondary | ICD-10-CM | POA: Diagnosis not present

## 2024-06-02 DIAGNOSIS — M79671 Pain in right foot: Secondary | ICD-10-CM | POA: Diagnosis not present

## 2024-06-02 DIAGNOSIS — M65979 Unspecified synovitis and tenosynovitis, unspecified ankle and foot: Secondary | ICD-10-CM | POA: Diagnosis not present

## 2024-06-02 DIAGNOSIS — M79672 Pain in left foot: Secondary | ICD-10-CM | POA: Diagnosis not present
